# Patient Record
Sex: Male | Born: 1985 | Race: White | Hispanic: No | Marital: Married
Health system: Midwestern US, Academic
[De-identification: ages and names within clinical notes are randomized; demographics above are authoritative.]

---

## 2016-08-24 MED ORDER — TRIAMCINOLONE ACETONIDE 40 MG/ML IJ SUSP
80 mg | Freq: Once | EPIDURAL | 0 refills | Status: CP
Start: 2016-08-24 — End: ?

## 2016-08-24 MED ORDER — IOPAMIDOL 41 % IT SOLN
2.5 mL | Freq: Once | EPIDURAL | 0 refills | Status: CP
Start: 2016-08-24 — End: ?

## 2016-08-24 MED ORDER — BUPIVACAINE (PF) 0.25 % (2.5 MG/ML) IJ SOLN
4 mL | Freq: Once | INTRAMUSCULAR | 0 refills | Status: CP
Start: 2016-08-24 — End: ?

## 2017-02-05 ENCOUNTER — Encounter: Admit: 2017-02-05 | Discharge: 2017-02-05

## 2017-02-05 DIAGNOSIS — D492 Neoplasm of unspecified behavior of bone, soft tissue, and skin: ICD-10-CM

## 2017-02-05 DIAGNOSIS — R0782 Intercostal pain: ICD-10-CM

## 2017-02-05 DIAGNOSIS — R0781 Pleurodynia: ICD-10-CM

## 2017-02-05 DIAGNOSIS — M549 Dorsalgia, unspecified: Principal | ICD-10-CM

## 2017-02-05 MED ORDER — NORTRIPTYLINE 10 MG PO CAP
10 mg | ORAL_CAPSULE | Freq: Every evening | ORAL | 4 refills | Status: AC
Start: 2017-02-05 — End: 2017-05-09

## 2017-02-05 NOTE — Progress Notes
[facility] CLINIC NOTE  Subjective     SUBJECTIVE: [name] presents in follow-up for ongoing care regarding thoracic pain.  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  Most activities including her his duties as an Technical sales engineer in [employer] exacerbate his pain is pain is improved with rest         Review of Systems   Constitutional: Positive for activity change and unexpected weight change.   HENT: Negative.    Eyes: Negative.    Respiratory: Negative.    Cardiovascular: Negative.    Gastrointestinal: Negative.    Endocrine: Negative.    Genitourinary: Negative.    Musculoskeletal: Negative.    Skin: Negative.    Allergic/Immunologic: Negative.    Neurological: Negative.    Hematological: Negative.    Psychiatric/Behavioral: Negative.    All other systems reviewed and are negative.      Current Outpatient Prescriptions:   ???  acetaminophen (TYLENOL) 500 mg tablet, Take 1-2 Tabs by mouth every 6 hours while awake. Max of 4,000 mg of acetaminophen in 24 hours., Disp: , Rfl: 0  ???  gabapentin (NEURONTIN) 300 mg capsule, Take 1 Cap by mouth three times daily. In addition to 600 mg capsule TID for a total of 900 mg TID., Disp: 270 Cap, Rfl: 3  ???  gabapentin (NEURONTIN) 600 mg tablet, Take 1 Tab by mouth three times daily., Disp: 90 Cap, Rfl: 1  ???  HYDROcodone/acetaminophen (NORCO) 5/325 mg tablet, Take 1 Tab by mouth every 12 hours as needed, Disp: 60 Tab, Rfl: 0  ???  ibuprofen (MOTRIN) 600 mg tablet, Take 1 Tab by mouth every 6 hours. Take with food. Drink at least 64oz of water per day while taking this medication. Take this as scheduled every 6 hours for the next 72 hours then you may decrease it to every 6 hours as needed., Disp: 30 Tab, Rfl: 0  ???  melatonin 3 mg tab, Take 3 mg by mouth at bedtime daily., Disp: , Rfl:   ???  nortriptyline (PAMELOR) 10 mg capsule, Take one capsule by mouth at bedtime daily., Disp: 30 capsule, Rfl: 4  No Known Allergies  Physical Exam  Vitals: 02/05/17 1435   BP: 132/85   Pulse: 82   Weight: 88.5 kg (195 lb)   Height: 180.3 cm (71)     Oswestry Total Score:: 42  Pain Score: Seven  Body mass index is 27.2 kg/m???.  General: Alert and oriented, very pleasant male.   HEENT showed extraocular muscles were intact and no other abnormalities.  Unlabored breathing.  Regular rate and rhythm on CV exam.   5/5 strength in bilateral upper and lower extremities.    Sensation is intact to light touch and equal in the upper and lower extremities.  The right anterior chest wall tenderness to palpation         IMPRESSION:  1. Rib pain on right side    2. Intercostal neuralgia          PLAN: We will schedule a 1 month clinic follow-up and will increase gabapentin to 3600 mg 3 times a day and will trial nortriptyline 10 mg at night

## 2017-02-06 ENCOUNTER — Ambulatory Visit: Admit: 2017-02-05 | Discharge: 2017-02-06

## 2017-02-06 DIAGNOSIS — Z9889 Other specified postprocedural states: ICD-10-CM

## 2017-02-06 DIAGNOSIS — R0781 Pleurodynia: Principal | ICD-10-CM

## 2017-02-06 DIAGNOSIS — G588 Other specified mononeuropathies: ICD-10-CM

## 2017-03-13 ENCOUNTER — Encounter: Admit: 2017-03-13 | Discharge: 2017-03-13

## 2017-05-09 ENCOUNTER — Encounter: Admit: 2017-05-09 | Discharge: 2017-05-09

## 2017-05-09 ENCOUNTER — Ambulatory Visit: Admit: 2017-05-09 | Discharge: 2017-05-10

## 2017-05-09 DIAGNOSIS — D492 Neoplasm of unspecified behavior of bone, soft tissue, and skin: ICD-10-CM

## 2017-05-09 DIAGNOSIS — M549 Dorsalgia, unspecified: Principal | ICD-10-CM

## 2017-05-09 DIAGNOSIS — R0781 Pleurodynia: ICD-10-CM

## 2017-05-09 MED ORDER — NORTRIPTYLINE 25 MG PO CAP
25 mg | ORAL_CAPSULE | Freq: Every evening | ORAL | 2 refills | Status: SS
Start: 2017-05-09 — End: 2018-07-07

## 2017-05-10 DIAGNOSIS — G588 Other specified mononeuropathies: Principal | ICD-10-CM

## 2017-05-10 DIAGNOSIS — R1011 Right upper quadrant pain: ICD-10-CM

## 2017-05-28 ENCOUNTER — Encounter: Admit: 2017-05-28 | Discharge: 2017-05-28

## 2017-05-28 ENCOUNTER — Ambulatory Visit: Admit: 2017-05-28 | Discharge: 2017-05-29

## 2017-05-28 DIAGNOSIS — R0781 Pleurodynia: ICD-10-CM

## 2017-05-28 DIAGNOSIS — D492 Neoplasm of unspecified behavior of bone, soft tissue, and skin: ICD-10-CM

## 2017-05-28 DIAGNOSIS — M546 Pain in thoracic spine: Principal | ICD-10-CM

## 2017-05-28 DIAGNOSIS — M549 Dorsalgia, unspecified: Principal | ICD-10-CM

## 2017-05-28 MED ORDER — IOPAMIDOL 41 % IT SOLN
2.5 mL | Freq: Once | EPIDURAL | 0 refills | Status: CP
Start: 2017-05-28 — End: ?
  Administered 2017-05-28: 16:00:00 2.5 mL via EPIDURAL

## 2017-05-28 MED ORDER — TRIAMCINOLONE ACETONIDE 40 MG/ML IJ SUSP
80 mg | Freq: Once | EPIDURAL | 0 refills | Status: CP
Start: 2017-05-28 — End: ?
  Administered 2017-05-28: 16:00:00 80 mg via EPIDURAL

## 2017-05-29 DIAGNOSIS — M546 Pain in thoracic spine: ICD-10-CM

## 2017-05-29 DIAGNOSIS — G588 Other specified mononeuropathies: Principal | ICD-10-CM

## 2017-06-04 MED ORDER — TRIAMCINOLONE ACETONIDE 40 MG/ML IJ SUSP
40 mg | Freq: Once | INTRAMUSCULAR | 0 refills | Status: CP | PRN
Start: 2017-06-04 — End: ?
  Administered 2017-06-04: 18:00:00 40 mg via INTRAMUSCULAR

## 2017-06-04 MED ORDER — BUPIVACAINE (PF) 0.25 % (2.5 MG/ML) IJ SOLN
3 mL | Freq: Once | INTRAMUSCULAR | 0 refills | Status: CP | PRN
Start: 2017-06-04 — End: ?
  Administered 2017-06-04: 18:00:00 3 mL via INTRAMUSCULAR

## 2017-06-28 ENCOUNTER — Encounter: Admit: 2017-06-28 | Discharge: 2017-06-28

## 2017-06-28 DIAGNOSIS — R0781 Pleurodynia: ICD-10-CM

## 2017-06-28 DIAGNOSIS — M549 Dorsalgia, unspecified: Principal | ICD-10-CM

## 2017-06-28 DIAGNOSIS — D492 Neoplasm of unspecified behavior of bone, soft tissue, and skin: ICD-10-CM

## 2017-07-13 ENCOUNTER — Ambulatory Visit: Admit: 2017-07-13 | Discharge: 2017-07-13

## 2017-07-13 ENCOUNTER — Encounter: Admit: 2017-07-13 | Discharge: 2017-07-13

## 2017-07-13 DIAGNOSIS — Z9889 Other specified postprocedural states: ICD-10-CM

## 2017-07-13 DIAGNOSIS — K219 Gastro-esophageal reflux disease without esophagitis: ICD-10-CM

## 2017-07-13 DIAGNOSIS — D492 Neoplasm of unspecified behavior of bone, soft tissue, and skin: ICD-10-CM

## 2017-07-13 DIAGNOSIS — D3610 Benign neoplasm of peripheral nerves and autonomic nervous system, unspecified: ICD-10-CM

## 2017-07-13 DIAGNOSIS — R1011 Right upper quadrant pain: ICD-10-CM

## 2017-07-13 DIAGNOSIS — R079 Chest pain, unspecified: ICD-10-CM

## 2017-07-13 DIAGNOSIS — G8918 Other acute postprocedural pain: Principal | ICD-10-CM

## 2017-07-13 DIAGNOSIS — R1013 Epigastric pain: ICD-10-CM

## 2017-07-13 DIAGNOSIS — R0781 Pleurodynia: ICD-10-CM

## 2017-07-13 DIAGNOSIS — M549 Dorsalgia, unspecified: Principal | ICD-10-CM

## 2017-07-30 ENCOUNTER — Encounter: Admit: 2017-07-30 | Discharge: 2017-07-30

## 2017-08-01 ENCOUNTER — Encounter: Admit: 2017-08-01 | Discharge: 2017-08-01

## 2017-08-01 DIAGNOSIS — K210 Gastro-esophageal reflux disease with esophagitis: ICD-10-CM

## 2017-08-01 DIAGNOSIS — R1011 Right upper quadrant pain: Principal | ICD-10-CM

## 2017-08-01 DIAGNOSIS — R1013 Epigastric pain: ICD-10-CM

## 2017-08-11 DIAGNOSIS — K219 Gastro-esophageal reflux disease without esophagitis: Secondary | ICD-10-CM

## 2017-08-11 DIAGNOSIS — R1013 Epigastric pain: Secondary | ICD-10-CM

## 2017-08-16 ENCOUNTER — Ambulatory Visit: Admit: 2017-08-16 | Discharge: 2017-08-16

## 2017-08-16 ENCOUNTER — Encounter: Admit: 2017-08-16 | Discharge: 2017-08-16

## 2017-08-16 DIAGNOSIS — R079 Chest pain, unspecified: ICD-10-CM

## 2017-08-16 DIAGNOSIS — K760 Fatty (change of) liver, not elsewhere classified: Principal | ICD-10-CM

## 2017-08-16 DIAGNOSIS — R1011 Right upper quadrant pain: Principal | ICD-10-CM

## 2017-08-17 ENCOUNTER — Encounter: Admit: 2017-08-17 | Discharge: 2017-08-17

## 2017-08-24 ENCOUNTER — Encounter: Admit: 2017-08-24 | Discharge: 2017-08-24

## 2017-08-28 ENCOUNTER — Encounter: Admit: 2017-08-28 | Discharge: 2017-08-28

## 2017-08-30 ENCOUNTER — Encounter: Admit: 2017-08-30 | Discharge: 2017-08-30

## 2017-09-12 ENCOUNTER — Encounter: Admit: 2017-09-12 | Discharge: 2017-09-13

## 2017-09-12 ENCOUNTER — Encounter: Admit: 2017-09-12 | Discharge: 2017-09-12

## 2017-09-12 ENCOUNTER — Ambulatory Visit: Admit: 2017-09-12 | Discharge: 2017-09-12

## 2017-09-12 DIAGNOSIS — R1011 Right upper quadrant pain: Principal | ICD-10-CM

## 2017-09-12 DIAGNOSIS — K219 Gastro-esophageal reflux disease without esophagitis: Secondary | ICD-10-CM

## 2017-09-12 DIAGNOSIS — R1013 Epigastric pain: ICD-10-CM

## 2017-09-12 DIAGNOSIS — M549 Dorsalgia, unspecified: Principal | ICD-10-CM

## 2017-09-12 DIAGNOSIS — I10 Essential (primary) hypertension: ICD-10-CM

## 2017-09-12 DIAGNOSIS — R0781 Pleurodynia: ICD-10-CM

## 2017-09-12 DIAGNOSIS — D492 Neoplasm of unspecified behavior of bone, soft tissue, and skin: ICD-10-CM

## 2017-09-12 MED ORDER — LIDOCAINE (PF) 10 MG/ML (1 %) IJ SOLN
.1-2 mL | INTRAMUSCULAR | 0 refills | Status: AC | PRN
Start: 2017-09-12 — End: 2019-06-10

## 2017-09-12 MED ORDER — PROPOFOL 10 MG/ML IV EMUL 20 ML (INFUSION)(AM)(OR)
INTRAVENOUS | 0 refills | Status: DC
Start: 2017-09-12 — End: 2017-09-12

## 2017-09-12 MED ORDER — PROPOFOL INJ 10 MG/ML IV VIAL
0 refills | Status: DC
Start: 2017-09-12 — End: 2017-09-12

## 2017-09-12 MED ORDER — LIDOCAINE (PF) 200 MG/10 ML (2 %) IJ SYRG
0 refills | Status: DC
Start: 2017-09-12 — End: 2017-09-12

## 2017-09-12 MED ORDER — LACTATED RINGERS IV SOLP
1000 mL | INTRAVENOUS | 0 refills | Status: AC
Start: 2017-09-12 — End: ?

## 2017-09-12 MED ORDER — LACTATED RINGERS IV SOLP
INTRAVENOUS | 0 refills | Status: AC
Start: 2017-09-12 — End: ?

## 2017-09-13 ENCOUNTER — Encounter: Admit: 2017-09-13 | Discharge: 2017-09-13

## 2017-09-13 DIAGNOSIS — M549 Dorsalgia, unspecified: Principal | ICD-10-CM

## 2017-09-13 DIAGNOSIS — D492 Neoplasm of unspecified behavior of bone, soft tissue, and skin: ICD-10-CM

## 2017-09-13 DIAGNOSIS — R0781 Pleurodynia: ICD-10-CM

## 2017-09-13 DIAGNOSIS — I10 Essential (primary) hypertension: ICD-10-CM

## 2017-10-04 ENCOUNTER — Encounter: Admit: 2017-10-04 | Discharge: 2017-10-04

## 2017-11-02 ENCOUNTER — Ambulatory Visit: Admit: 2017-11-02 | Discharge: 2017-11-03

## 2017-11-02 ENCOUNTER — Encounter: Admit: 2017-11-02 | Discharge: 2017-11-02

## 2017-11-02 MED ORDER — GABAPENTIN 600 MG PO TAB
1200 mg | ORAL_TABLET | ORAL | 3 refills | Status: SS
Start: 2017-11-02 — End: 2018-07-07

## 2017-11-02 MED ORDER — HYDROCODONE-ACETAMINOPHEN 5-325 MG PO TAB
1 | ORAL_TABLET | Freq: Two times a day (BID) | ORAL | 0 refills | 30.00000 days | Status: AC | PRN
Start: 2017-11-02 — End: 2019-05-20

## 2017-11-03 DIAGNOSIS — G588 Other specified mononeuropathies: Principal | ICD-10-CM

## 2017-11-03 DIAGNOSIS — R0781 Pleurodynia: ICD-10-CM

## 2017-11-28 ENCOUNTER — Ambulatory Visit: Admit: 2017-11-28 | Discharge: 2017-11-29

## 2017-11-28 ENCOUNTER — Encounter: Admit: 2017-11-28 | Discharge: 2017-11-28

## 2017-11-28 DIAGNOSIS — I10 Essential (primary) hypertension: ICD-10-CM

## 2017-11-28 DIAGNOSIS — R0781 Pleurodynia: ICD-10-CM

## 2017-11-28 DIAGNOSIS — D492 Neoplasm of unspecified behavior of bone, soft tissue, and skin: ICD-10-CM

## 2017-11-28 DIAGNOSIS — M549 Dorsalgia, unspecified: Principal | ICD-10-CM

## 2017-11-28 MED ORDER — TRIAMCINOLONE ACETONIDE 40 MG/ML IJ SUSP
40 mg | Freq: Once | EPIDURAL | 0 refills | Status: CP
Start: 2017-11-28 — End: ?
  Administered 2017-11-28: 18:00:00 40 mg via EPIDURAL

## 2017-11-28 MED ORDER — IOPAMIDOL 41 % IT SOLN
2.5 mL | Freq: Once | EPIDURAL | 0 refills | Status: CP
Start: 2017-11-28 — End: ?
  Administered 2017-11-28: 18:00:00 2.5 mL via EPIDURAL

## 2017-11-29 ENCOUNTER — Encounter: Admit: 2017-11-29 | Discharge: 2017-11-29

## 2017-11-29 DIAGNOSIS — G588 Other specified mononeuropathies: Principal | ICD-10-CM

## 2017-11-29 DIAGNOSIS — I10 Essential (primary) hypertension: ICD-10-CM

## 2018-04-23 ENCOUNTER — Encounter: Admit: 2018-04-23 | Discharge: 2018-04-23

## 2018-05-17 ENCOUNTER — Encounter: Admit: 2018-05-17 | Discharge: 2018-05-17

## 2018-05-17 DIAGNOSIS — R0781 Pleurodynia: ICD-10-CM

## 2018-05-17 DIAGNOSIS — G588 Other specified mononeuropathies: Principal | ICD-10-CM

## 2018-05-17 NOTE — Telephone Encounter
MA spoke with patient wife. She states that [facility] sent over a new referral for patient to have another nerve block. She called to schedule and was told Dr. Samara Deist would have to put in a new order.  Please place order for nerve block.

## 2018-05-21 ENCOUNTER — Encounter: Admit: 2018-05-21 | Discharge: 2018-05-21

## 2018-05-28 ENCOUNTER — Encounter: Admit: 2018-05-28 | Discharge: 2018-05-28

## 2018-05-28 ENCOUNTER — Ambulatory Visit: Admit: 2018-05-28 | Discharge: 2018-05-29

## 2018-05-28 ENCOUNTER — Ambulatory Visit: Admit: 2018-05-28 | Discharge: 2018-05-28

## 2018-05-28 DIAGNOSIS — R0781 Pleurodynia: ICD-10-CM

## 2018-05-28 DIAGNOSIS — D492 Neoplasm of unspecified behavior of bone, soft tissue, and skin: ICD-10-CM

## 2018-05-28 DIAGNOSIS — M549 Dorsalgia, unspecified: Principal | ICD-10-CM

## 2018-05-28 DIAGNOSIS — I10 Essential (primary) hypertension: ICD-10-CM

## 2018-05-28 DIAGNOSIS — G588 Other specified mononeuropathies: Principal | ICD-10-CM

## 2018-05-28 MED ORDER — LIDOCAINE (PF) 10 MG/ML (1 %) IJ SOLN
2 mL | Freq: Once | INTRAMUSCULAR | 0 refills | Status: CP
Start: 2018-05-28 — End: ?

## 2018-05-28 MED ORDER — TRIAMCINOLONE ACETONIDE 40 MG/ML IJ SUSP
80 mg | Freq: Once | EPIDURAL | 0 refills | Status: CP
Start: 2018-05-28 — End: ?

## 2018-05-28 MED ORDER — IOHEXOL 300 MG IODINE/ML IV SOLN
2 mL | Freq: Once | 0 refills | Status: CP
Start: 2018-05-28 — End: ?

## 2018-05-28 NOTE — Discharge Instructions - Supplementary Instructions
at 20 minute intervals for the first 24 hours. The following day you may alternate ice with heat if you are experiencing muscle tightness, otherwise continue with ice. Ice works best at decreasing pain. Avoid application of direct heat, hot showers or hot tubs today.  4. Avoid strenuous activity today. You many resume your regular activities and exercise tomorrow.  5. Patients with diabetes may see an elevation in blood sugars for 7-10 days after the injection. It is important to pay close attention to you diet, check your blood sugars daily and repost extreme elevations to the physician that treats your diabetes.  6. Patients taking daily blood thinners can resume their regular dose this evening.  7. It is important that you take all medications ordered by your pain physician. Taking medications as ordered is an important part of you pain care plan. If you cannot continue the medication plan, please notify the physician.  Possible side effects to steroids that may occur:  ? Flushing or redness of the face  ? Irritability  ? Fluid retention  ? Change in women's menses  Follow up appointment as needed if in the event you are unable to keep an appointment please notify the scheduler 24 hours in advance at [phone].

## 2018-05-29 ENCOUNTER — Encounter: Admit: 2018-05-29 | Discharge: 2018-05-29

## 2018-05-29 DIAGNOSIS — G588 Other specified mononeuropathies: Principal | ICD-10-CM

## 2018-07-06 ENCOUNTER — Emergency Department: Admit: 2018-07-06 | Discharge: 2018-07-06

## 2018-07-06 ENCOUNTER — Encounter: Admit: 2018-07-06 | Discharge: 2018-07-06

## 2018-07-06 ENCOUNTER — Inpatient Hospital Stay: Admit: 2018-07-06 | Discharge: 2018-07-06

## 2018-07-06 DIAGNOSIS — R0781 Pleurodynia: ICD-10-CM

## 2018-07-06 DIAGNOSIS — R630 Anorexia: ICD-10-CM

## 2018-07-06 DIAGNOSIS — I10 Essential (primary) hypertension: ICD-10-CM

## 2018-07-06 DIAGNOSIS — B002 Herpesviral gingivostomatitis and pharyngotonsillitis: Secondary | ICD-10-CM

## 2018-07-06 DIAGNOSIS — M549 Dorsalgia, unspecified: Principal | ICD-10-CM

## 2018-07-06 DIAGNOSIS — D492 Neoplasm of unspecified behavior of bone, soft tissue, and skin: ICD-10-CM

## 2018-07-06 DIAGNOSIS — Z20828 Contact with and (suspected) exposure to other viral communicable diseases: ICD-10-CM

## 2018-07-06 LAB — RVP VIRAL PANEL PCR

## 2018-07-06 LAB — MONO SPOT: Lab: NEGATIVE

## 2018-07-06 LAB — POC TROPONIN: Lab: 0 ng/mL (ref 0.00–0.05)

## 2018-07-06 LAB — POC SODIUM: Lab: 140 MMOL/L (ref 137–147)

## 2018-07-06 LAB — RAPID STREP A SCREEN

## 2018-07-06 LAB — POC BLOOD GAS VEN
Lab: 22 MMOL/L
Lab: 7.5 % — ABNORMAL HIGH (ref 7.30–7.40)

## 2018-07-06 LAB — FERRITIN: Lab: 80 ng/mL — ABNORMAL LOW (ref 30–300)

## 2018-07-06 LAB — D-DIMER: Lab: 396 ng{FEU}/mL (ref <500)

## 2018-07-06 LAB — CBC AND DIFF: Lab: 6 10*3/uL (ref 4.5–11.0)

## 2018-07-06 LAB — PROTIME INR (PT): Lab: 1.2 M/UL (ref 0.8–1.2)

## 2018-07-06 LAB — POC HEMATOCRIT: Lab: 15 g/dL (ref 13.5–16.5)

## 2018-07-06 LAB — POC IONIZED CALCIUM: Lab: 1.1 MMOL/L (ref 1.0–1.3)

## 2018-07-06 LAB — POC LACTATE: Lab: 2.7 MMOL/L — ABNORMAL HIGH (ref 0.5–2.0)

## 2018-07-06 LAB — C REACTIVE PROTEIN (CRP): Lab: 3.3 mg/dL — ABNORMAL HIGH (ref <1.0)

## 2018-07-06 MED ORDER — ACETAMINOPHEN 325 MG PO TAB
650 mg | Freq: Once | ORAL | 0 refills | Status: CP
Start: 2018-07-06 — End: ?
  Administered 2018-07-06: 22:00:00 650 mg via ORAL

## 2018-07-06 MED ORDER — GABAPENTIN 300 MG PO CAP
900 mg | Freq: Three times a day (TID) | ORAL | 0 refills | Status: DC
Start: 2018-07-06 — End: 2018-07-12
  Administered 2018-07-07 – 2018-07-12 (×19): 900 mg via ORAL

## 2018-07-06 MED ORDER — POTASSIUM CHLORIDE IN WATER 10 MEQ/50 ML IV PGBK
10 meq | INTRAVENOUS | 0 refills | Status: CP
Start: 2018-07-06 — End: ?
  Administered 2018-07-06 – 2018-07-07 (×4): 10 meq via INTRAVENOUS

## 2018-07-06 MED ORDER — HYDROCODONE-ACETAMINOPHEN 5-325 MG PO TAB
1 | Freq: Two times a day (BID) | ORAL | 0 refills | Status: DC | PRN
Start: 2018-07-06 — End: 2018-07-12
  Administered 2018-07-06 – 2018-07-09 (×5): 1 via ORAL

## 2018-07-06 MED ORDER — ACETAMINOPHEN 325 MG PO TAB
650 mg | Freq: Once | ORAL | 0 refills | Status: CP
Start: 2018-07-06 — End: ?
  Administered 2018-07-07: 03:00:00 650 mg via ORAL

## 2018-07-06 MED ORDER — ENOXAPARIN 40 MG/0.4 ML SC SYRG
40 mg | Freq: Every day | SUBCUTANEOUS | 0 refills | Status: DC
Start: 2018-07-06 — End: 2018-07-12
  Administered 2018-07-07 – 2018-07-10 (×4): 40 mg via SUBCUTANEOUS

## 2018-07-06 MED ORDER — LACTATED RINGERS IV SOLP
1000 mL | Freq: Once | INTRAVENOUS | 0 refills | Status: CP
Start: 2018-07-06 — End: ?
  Administered 2018-07-06: 22:00:00 1000 mL via INTRAVENOUS

## 2018-07-06 MED ORDER — BENZONATATE 100 MG PO CAP
100 mg | Freq: Once | ORAL | 0 refills | Status: CP | PRN
Start: 2018-07-06 — End: ?
  Administered 2018-07-06: 22:00:00 100 mg via ORAL

## 2018-07-06 MED ADMIN — SODIUM CHLORIDE 0.9 % IV SOLP [27838]: 1000 mL | INTRAVENOUS | @ 23:00:00 | Stop: 2018-07-06 | NDC 00338004904

## 2018-07-06 NOTE — ED Notes
Pt is a 33 y.o. male who presents to the ED for a CC of SOB. Pt has a past medical history of Back pain, Hypertension, Rib pain, and Thoracic spine tumor.     Pt reports shortness of breath, fever, and cough that started two days ago. Pt states that he went to [facility] in [place] last evening and was tested for COVID 19 and sent home. Pt presents to [place] for worsening SOB and pt hard to speak in full sentences. Pt reports pain in ribs and chest wall during inspiration or coughing. Afebrile on assessment but reports a fever at 0700 at 100.5 and took tylenol then.   Pt AA&Ox4;  Call light within reach, bed in lowest and locked position; oriented to surroundings  Await MD eval.     All belongings gathered and placed in belonging bag with patient labels at bedside.  The bag(s) contain(s) the following:  Clothing: shirt; jeans; socks  Shoes: shoes  Jewelry: ring  Identification/Driver's License: Yes  Cash: 0  Credit Cards: 0  Electronics: cell phone

## 2018-07-07 ENCOUNTER — Inpatient Hospital Stay: Admit: 2018-07-07 | Discharge: 2018-07-07

## 2018-07-07 LAB — FREE T4-FREE THYROXINE: Lab: 0.8 ng/dL (ref 0.6–1.6)

## 2018-07-07 LAB — MAGNESIUM: Lab: 1.7 mg/dL (ref 1.6–2.6)

## 2018-07-07 LAB — TSH WITH FREE T4 REFLEX: Lab: 0.1 uU/mL — ABNORMAL LOW (ref 0.35–5.00)

## 2018-07-07 LAB — PHOSPHORUS: Lab: 3.3 mg/dL (ref 2.0–4.5)

## 2018-07-07 MED ORDER — DIPHENHYDRAMINE(#)/LIDOCAINE/ANTACID 1:1:1 PO SUSP
30 mL | Freq: Three times a day (TID) | ORAL | 0 refills | Status: DC | PRN
Start: 2018-07-07 — End: 2018-07-12
  Administered 2018-07-08: 02:00:00 30 mL via ORAL

## 2018-07-07 MED ORDER — LACTATED RINGERS IV SOLP
INTRAVENOUS | 0 refills | Status: DC
Start: 2018-07-07 — End: 2018-07-12
  Administered 2018-07-07 – 2018-07-12 (×12): 1000.000 mL via INTRAVENOUS

## 2018-07-07 MED ORDER — ACETAMINOPHEN 500 MG PO TAB
500 mg | ORAL | 0 refills | Status: DC | PRN
Start: 2018-07-07 — End: 2018-07-12
  Administered 2018-07-07 – 2018-07-10 (×7): 500 mg via ORAL

## 2018-07-07 MED ORDER — DOXYCYCLINE HYCLATE 100 MG PO TAB
100 mg | Freq: Two times a day (BID) | ORAL | 0 refills | Status: DC
Start: 2018-07-07 — End: 2018-07-08
  Administered 2018-07-08 (×2): 100 mg via ORAL

## 2018-07-07 MED ORDER — ONDANSETRON HCL (PF) 4 MG/2 ML IJ SOLN
4 mg | INTRAVENOUS | 0 refills | Status: DC | PRN
Start: 2018-07-07 — End: 2018-07-12
  Administered 2018-07-08 – 2018-07-09 (×2): 4 mg via INTRAVENOUS

## 2018-07-07 MED ORDER — CEFTRIAXONE INJ 1GM IVP
1 g | INTRAVENOUS | 0 refills | Status: DC
Start: 2018-07-07 — End: 2018-07-08
  Administered 2018-07-07 – 2018-07-08 (×2): 1 g via INTRAVENOUS

## 2018-07-07 NOTE — Progress Notes
Pt notified RN that he had vomited up blood tinged liquid. Pt stated he was on the phone with family and was coughing and threw up on the floor. MD notified via text page (Tracking [phone]). Pt not feeling nauseous at this time. O2 100% on RA. RN will continue to monitor.

## 2018-07-07 NOTE — Progress Notes
1600 RN to patient's room due to patients HR sustaining in the 150's-160's. Patient was throwing up in the toilet and stated I fell so terrible. Patient was dizzy and lightheaded upon standing and walking back to bed. MD paged.

## 2018-07-08 ENCOUNTER — Encounter: Admit: 2018-07-08 | Discharge: 2018-07-08

## 2018-07-08 ENCOUNTER — Inpatient Hospital Stay: Admit: 2018-07-08 | Discharge: 2018-07-08

## 2018-07-08 DIAGNOSIS — I10 Essential (primary) hypertension: ICD-10-CM

## 2018-07-08 DIAGNOSIS — M549 Dorsalgia, unspecified: Principal | ICD-10-CM

## 2018-07-08 DIAGNOSIS — R0781 Pleurodynia: ICD-10-CM

## 2018-07-08 DIAGNOSIS — R0602 Shortness of breath: ICD-10-CM

## 2018-07-08 DIAGNOSIS — D492 Neoplasm of unspecified behavior of bone, soft tissue, and skin: ICD-10-CM

## 2018-07-08 LAB — CULTURE-STREP SCREEN

## 2018-07-08 LAB — TROPONIN-I: Lab: 0 ng/mL — ABNORMAL LOW (ref >60)

## 2018-07-08 MED ORDER — AMOXICILLIN-POT CLAVULANATE 875-125 MG PO TAB
875 mg | Freq: Two times a day (BID) | ORAL | 0 refills | Status: DC
Start: 2018-07-08 — End: 2018-07-12
  Administered 2018-07-08 – 2018-07-12 (×8): 875 mg via ORAL

## 2018-07-08 MED ORDER — ACETAMINOPHEN/LIDOCAINE/ANTACID DS(#) 1:1:3  PO SUSP
30 mL | Freq: Once | ORAL | 0 refills | Status: CP
Start: 2018-07-08 — End: ?
  Administered 2018-07-08: 11:00:00 30 mL via ORAL

## 2018-07-08 MED ORDER — TRAMADOL 50 MG PO TAB
50 mg | Freq: Once | ORAL | 0 refills | Status: CP
Start: 2018-07-08 — End: ?
  Administered 2018-07-09: 05:00:00 50 mg via ORAL

## 2018-07-08 MED ORDER — LIDOCAINE 5 % TP PTMD
1 | Freq: Every day | TOPICAL | 0 refills | Status: DC
Start: 2018-07-08 — End: 2018-07-12

## 2018-07-08 MED ORDER — PATCH DOCUMENTATION - LIDOCAINE 5%: TRANSDERMAL | 0 refills | Status: DC

## 2018-07-08 MED ORDER — VALACYCLOVIR 500 MG PO TAB
1000 mg | Freq: Two times a day (BID) | ORAL | 0 refills | Status: DC
Start: 2018-07-08 — End: 2018-07-12
  Administered 2018-07-09 – 2018-07-12 (×8): 1000 mg via ORAL

## 2018-07-08 NOTE — Consults
He noticed a sore on lip later (not sure day). He had temp of 102 then 103 the following 2 days with worsening mouth pain. He then noticed a new pain R lateral chest - same area but different in quality to his nml CP.  He had some sob In that it was harder to take a deep breath. He had no sinus pain, drainage but + HA and some earache. He had occ mild cough the day he went to [place] and had covid testing (now back and negative). He was told he Might have pna on xray but sent home w/o antibiotic and no other testing per pt. 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 He denies any known sick contacts at home - daughter w flu a but that was a couple mo ago. He had no known positive COVID-19 contacts. While waiting for test result his cp worse, had some cough and was on phone w his mother who encouraged him to come to [place].  In ED afebrile, RR 26, o2 sat 96^, BP nml, HR 97.  Lungs: clear Trop 0, wbc 6, crp 332, Ferr 80, lactate 2.7, rapid strep neg, ddimer 396, alc 800. COVID from outside neg. Internal COVID negative. RVP neg. Mono neg. CXR min bibasilar atelectasis vs opacities? Improved on 4/13 film.  He had fever to 39 on 4/12. He has been on RA.    He does not feel any better today (notably worsening gingival pain). Had noticed some bleeding w 1 episode emesis he had (streaks), no abd pain, diarrhea. No neck pain but has some pain w neck flexion due to LN on R side of neck. Had an unprovoked bruise R arm a few wks ago. No other easy bruising /bleeding.No weight loss.      Antimicrobial Start date End date   Rocephin/doxy 4/12 4/13                                 Estimated Creatinine Clearance: 129.1 mL/min (based on SCr of 1.03 mg/dL).    Past Medical History     Medical History:   Diagnosis Date   ??? Back pain    ??? Hypertension    ??? Rib pain    ??? Thoracic spine tumor Past Surgical History     Surgical History:   Procedure Laterality Date   ??? SINUS SURGERY  1995   ??? BUNIONECTOMY  2013   ??? APPENDECTOMY  03/26/12   ??? LASIK  05/13/15   ??? RIGHT VIDEO ASSISTED THORACOSCOPY, RIGHT T8-T9 TUMOR EXCISION Right 08/17/2015    Performed by Bryson Dames, MD at [facility]   ??? ESOPHAGOGASTRODUODENOSCOPY WITH SPECIMEN COLLECTION BY BRUSHING/ WASHING N/A 09/12/2017    Performed by Eliott Nine, MD at [facility] OR   ??? TONSIL AND ADENOIDECTOMY     ??? WISDOM TEETH EXTRACTION         Social History   Marital status/area of residence: married, lives w/wife and 2 kids  Job/occupation: Hotel manager recruit, active duty prior [place]  Sexual history:wife only  Travel history: see above, not recently  Recent ill contacts: none known  TB exposure: no, prior neg testing  Drugs of abuse: no  Social History     Tobacco Use   ??? Smoking status:  Never Smoker   ??? Smokeless tobacco: Current User     Types: Chew   ??? Tobacco comment: 2 pouches/week   Substance Use Topics   ??? Alcohol use: Yes     Alcohol/week: 12.0 standard drinks     Types: 12 Cans of beer per week       Family History     Family History   Problem Relation Age of Onset   ??? Diabetes Mother    ??? COPD Father    ??? Cancer Paternal Grandmother    ??? Cancer Paternal Grandfather        Allergies     Allergies   Allergen Reactions   ??? Oxycodone ANXIETY       Review of Systems   A comprehensive 14-point review of systems was negative with exception of:  Gingival pain  Blood in emesis, nausea, dec appetite  Lip sore  Ln in neck  R chest wall pain  Fever  RUQ pain    Medications   Scheduled Meds:cefTRIAXone (ROCEPHIN) IVP 1 g, 1 g, Intravenous, Q24H*  doxycycline (VIBRAMYCIN) tablet 100 mg, 100 mg, Oral, BID  enoxaparin (LOVENOX) syringe 40 mg, 40 mg, Subcutaneous, QDAY(21)  gabapentin (NEURONTIN) capsule 900 mg, 900 mg, Oral, TID  lidocaine (LIDODERM) 5 % topical patch 1 patch, 1 patch, Topical, QDAY    And

## 2018-07-08 NOTE — Progress Notes
07/07/18 2047   Vitals   Temp (!) 39 ???C (102.2 ???F)   Temperature Source Oral     Med Private B text paged about pt's unresolved fever. Tylenol not available until 2220. RN administered PRN Norco at this time. Will recheck in 1 hour.

## 2018-07-08 NOTE — Care Plan
Pt's COVID testing is negative.  Thus, pt is being moved from a COVID med team and unit to the following new med team:   New bed: [facility] 801  New team: med E, Dr Lucia Estelle attending, pager [phone]     Change of service order placed.

## 2018-07-08 NOTE — Case Management (ED)
Does residence have entry and/or side stairs?: No  Assistance needed prior to admit or anticipated on discharge: No  Who provides assistance or could if needed?: Pts wife   Are they in good health?: Unknown  Can support system provide 24/7 care if needed?: Maybe  ? Level of Function   Prior level of function: Independent  ? Cognitive Abilities   Cognitive Abilities: Alert and Oriented, Participates in decision making, Recognizes impact of health condition on lifestyle, Engages in problem solving and planning, Understands nature of health condition    Financial Resources  ? Coverage  Primary Insurance: Commercial insurance(Tricare)  Additional Coverage: RX    ? Source of Income   Source Of Income: Employed  ? Financial Assistance Needed?  N/A    Psychosocial Needs  ? Mental Health  Mental Health History: No  ? Substance Use History  Substance Use History Screen: Yes  Comment: Chew 1 can/week  ? Other  N/A    Current/Previous Services  ? PCP  Casimiro Needle, [phone], [phone]  ? Pharmacy    [facility]  [address]  [place] [address]  Phone: [phone] Fax: [phone]    [facility] [address] - [place] - [address]  [address]  [place] [address]  Phone: [phone] Fax: [phone]    [facility]  [address]  [place] [address]  Phone: [phone] Fax: [phone]    ? Durable Medical Equipment   Durable Medical Equipment at home: None  ? Home Health  Receiving home health: No  ? Hemodialysis or Peritoneal Dialysis  Undergoing hemodialysis or peritoneal dialysis: No  ? Tube/Enteral Feeds  Receive tube/enteral feeds: No  ? Infusion  Receive infusions: No  ? Private Duty  Private duty help used: No  ? Home and Community Based Services  Home and community based services: No  ? [employer]: N/A  ? Hospice  Hospice: No  ? Outpatient Therapy  PT: No  OT: No  SLP: No  ? Skilled Nursing Facility/Nursing Home  SNF: No

## 2018-07-08 NOTE — Progress Notes
Pt c/o 7/10 abd pain; states that norco and tylenol don't really work for him but agreeable to attempting both together.    Pt also c/o pain when biting and swallowing. Refuses to try magic mouthwash; pt states that he feels that it caused redness to spread in upper cleft when last attempted. Gingiva edematous and red. No bleeding noted at this time.     A&Ox4, VSS, RA, moves all extremities well.    Will continue to monitor.

## 2018-07-08 NOTE — Progress Notes
Patient arrived to room [phone] via wheelchair accompanied by transport. Patient transferred to the bed with assistance. Bedside safety checks completed. Initial patient assessment completed. Refer to flowsheet for details.    Admission skin assessment completed with: Maddie E., RN    Pressure injury present on arrival?: No      See Doc Flowsheet for additional wound details.     INTERVENTIONS: N/A

## 2018-07-08 NOTE — Progress Notes
-   Monitor blood pressure, currently stable  ???  FEN: IVF, monitor lytes, Regular diet  Ppx: Lovenox  FULL CODE  Dispo: continue inpatient  ID consulted  ________________________________________________________________________    Subjective  Franklin Graves is a 33 y.o. male.  Patient reports feeling tired, cough, sore throat, dyspnea, loss of sense of smell/taste, gum pain  Tenderness in Right lower chest/RUQ    Medications  Scheduled Meds:cefTRIAXone (ROCEPHIN) IVP 1 g, 1 g, Intravenous, Q24H*  doxycycline (VIBRAMYCIN) tablet 100 mg, 100 mg, Oral, BID  enoxaparin (LOVENOX) syringe 40 mg, 40 mg, Subcutaneous, QDAY(21)  gabapentin (NEURONTIN) capsule 900 mg, 900 mg, Oral, TID  lidocaine (LIDODERM) 5 % topical patch 1 patch, 1 patch, Topical, QDAY    And  Verification of Patch Placement and Integrity - Lidocaine 5%, , Transdermal, BID    Continuous Infusions:  ??? lactated ringers infusion 100 mL/hr at 07/08/18 0409     PRN and Respiratory Meds:acetaminophen Q6H PRN, diphenhydrAMINE/lidocaine/antacid (#) TID PRN, HYDROcodone/acetaminophen Q12H PRN, ondansetron (ZOFRAN) IV Q6H PRN        Objective:                          Vital Signs: Last Filed                 Vital Signs: 24 Hour Range   BP: 130/70 (04/13 0900)  Temp: 37.6 ???C (99.7 ???F) (04/13 0900)  Pulse: 105 (04/13 0900)  Respirations: 22 PER MINUTE (04/13 0546)  SpO2: 95 % (04/13 0900) BP: (125-146)/(70-91)   Temp:  [37.1 ???C (98.8 ???F)-39 ???C (102.2 ???F)]   Pulse:  [84-110]   Respirations:  [20 PER MINUTE-24 PER MINUTE]   SpO2:  [93 %-97 %]    Intensity Pain Scale (Self Report): 7 (07/08/18 4540) Vitals:    07/06/18 1419   Weight: 90.3 kg (199 lb)       Intake/Output Summary:  (Last 24 hours)    Intake/Output Summary (Last 24 hours) at 07/08/2018 1014  Last data filed at 07/08/2018 0726  Gross per 24 hour   Intake 1905 ml   Output 800 ml   Net 1105 ml           Physical Exam  General:  Alert, awake, appears to be in discomfort

## 2018-07-08 NOTE — Progress Notes
0535: Pt vomiting in bathroom. RN and PCA assisted patient back to bed. Dr. Cherly Beach to place orders for GI cocktail.    1610: VS taken at this time. O2 93% on RA, HR 109, Temp 99.16F, RR 22. With RN at bedside, pt develops sharp 6/10 left-sided chest pain. Dr. Cherly Beach notified via [place]. RN awaiting orders.     0550: Pt stating this pain has now moved to right side and is affecting his arm. Dr. Cherly Beach notified and orders placed for EKG, CXR, troponin. GI cocktail administered at this time. RN will continue to monitor.

## 2018-07-09 DIAGNOSIS — R0602 Shortness of breath: Secondary | ICD-10-CM

## 2018-07-09 LAB — CMV AB IGM: Lab: NEGATIVE

## 2018-07-09 LAB — HIV 1& 2 AG-AB SCRN W REFLEX HIV 1 PCR QUANT

## 2018-07-09 LAB — EPSTEIN BARR  PANEL(EBV)
Lab: NEGATIVE
Lab: NEGATIVE
Lab: POSITIVE
Lab: POSITIVE

## 2018-07-09 LAB — PERIPHERAL SMEAR

## 2018-07-09 LAB — C REACTIVE PROTEIN (CRP): Lab: 8.8 mg/dL — ABNORMAL HIGH (ref <1.0)

## 2018-07-09 LAB — SYPHILIS AB SCREEN: Lab: NEGATIVE MMOL/L (ref 21–28)

## 2018-07-09 LAB — D-DIMER: Lab: 497 ng{FEU}/mL (ref <500)

## 2018-07-09 LAB — LIPASE: Lab: 17 U/L (ref 11–82)

## 2018-07-09 LAB — FERRITIN: Lab: 179 ng/mL (ref 30–300)

## 2018-07-09 LAB — LDH-LACTATE DEHYDROGENASE: Lab: 130 U/L (ref 100–210)

## 2018-07-09 MED ORDER — SODIUM CHLORIDE 0.9 % IJ SOLN
50 mL | Freq: Once | INTRAVENOUS | 0 refills | Status: CP
Start: 2018-07-09 — End: ?
  Administered 2018-07-09: 07:00:00 50 mL via INTRAVENOUS

## 2018-07-09 MED ORDER — DIPHENHYDRAMINE HCL 25 MG PO CAP
25 mg | Freq: Once | ORAL | 0 refills | Status: CP
Start: 2018-07-09 — End: ?
  Administered 2018-07-10: 05:00:00 25 mg via ORAL

## 2018-07-09 MED ORDER — MAGNESIUM SULFATE IN D5W 1 GRAM/100 ML IV PGBK
1 g | Freq: Once | INTRAVENOUS | 0 refills | Status: CP
Start: 2018-07-09 — End: ?
  Administered 2018-07-10: 05:00:00 1 g via INTRAVENOUS

## 2018-07-09 MED ORDER — PROCHLORPERAZINE EDISYLATE 5 MG/ML IJ SOLN
10 mg | Freq: Once | INTRAVENOUS | 0 refills | Status: CP
Start: 2018-07-09 — End: ?
  Administered 2018-07-10: 05:00:00 10 mg via INTRAVENOUS

## 2018-07-09 MED ORDER — IOHEXOL 350 MG IODINE/ML IV SOLN
65 mL | Freq: Once | INTRAVENOUS | 0 refills | Status: CP
Start: 2018-07-09 — End: ?
  Administered 2018-07-09: 07:00:00 65 mL via INTRAVENOUS

## 2018-07-09 NOTE — Progress Notes
MPE notified of patient's worsening headache, and painful, swollen jaw. Orders received, will continue to monitor the patients.

## 2018-07-09 NOTE — Progress Notes
Has new aching across jaw, LN in neck still sore  No neck pain, cough sporadic/dry  CP on R side especially w pressure; not sob  No n/v  No BM for now several days  Gums not improved     Antimicrobial Start date End date   Rocephin/doxy 4/12 4/13   augmentin 4/13    valtrex 4/13                        Estimated Creatinine Clearance: 114 mL/min (based on SCr of 1.16 mg/dL).    Medications   Scheduled Meds:amoxicillin-potassium clavulanate (AUGMENTIN) tablet 875 mg, 875 mg, Oral, BID w/meals  enoxaparin (LOVENOX) syringe 40 mg, 40 mg, Subcutaneous, QDAY(21)  gabapentin (NEURONTIN) capsule 900 mg, 900 mg, Oral, TID  lidocaine (LIDODERM) 5 % topical patch 1 patch, 1 patch, Topical, QDAY    And  Verification of Patch Placement and Integrity - Lidocaine 5%, , Transdermal, BID  valACYclovir (VALTREX) tablet 1,000 mg, 1,000 mg, Oral, BID    Continuous Infusions:  ??? lactated ringers infusion 100 mL/hr at 07/09/18 1036     PRN and Respiratory Meds:acetaminophen Q6H PRN, diphenhydrAMINE/lidocaine/antacid (#) TID PRN, HYDROcodone/acetaminophen Q12H PRN, ondansetron (ZOFRAN) IV Q6H PRN      Physical Examination                          Vital Signs: Last                  Vital Signs: 24 Hour Range   BP: 127/92 (04/14 1126)  Temp: 36.4 ???C (97.6 ???F) (04/14 1126)  Pulse: 90 (04/14 1126)  Respirations: 16 PER MINUTE (04/14 1126)  SpO2: 99 % (04/14 1126)  Height: 180.3 cm (70.98) (04/13 1604) BP: (120-155)/(75-94)   Temp:  [36.4 ???C (97.6 ???F)-38.1 ???C (100.5 ???F)]   Pulse:  [88-103]   Respirations:  [16 PER MINUTE-18 PER MINUTE]   SpO2:  [96 %-99 %]      General appearance: alert, oriented, NAD  HENT: mucus membranes moist, fever blister tip of tongue is unchanged, 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

## 2018-07-10 ENCOUNTER — Encounter: Admit: 2018-07-10 | Discharge: 2018-07-10

## 2018-07-10 LAB — CBC AND DIFF: Lab: 4.9 10*3/uL — ABNORMAL LOW (ref 4.5–11.0)

## 2018-07-10 MED ORDER — MAGNESIUM SULFATE IN D5W 1 GRAM/100 ML IV PGBK
1 g | Freq: Once | INTRAVENOUS | 0 refills | Status: CP
Start: 2018-07-10 — End: ?
  Administered 2018-07-10: 22:00:00 1 g via INTRAVENOUS

## 2018-07-10 MED ORDER — METOCLOPRAMIDE HCL 5 MG/ML IJ SOLN
10 mg | Freq: Once | INTRAVENOUS | 0 refills | Status: CP
Start: 2018-07-10 — End: ?
  Administered 2018-07-10: 22:00:00 10 mg via INTRAVENOUS

## 2018-07-10 MED ORDER — NORTRIPTYLINE 25 MG PO CAP
25 mg | Freq: Every evening | ORAL | 0 refills | Status: DC
Start: 2018-07-10 — End: 2018-07-12
  Administered 2018-07-11 – 2018-07-12 (×2): 25 mg via ORAL

## 2018-07-10 MED ORDER — KETOROLAC 30 MG/ML (1 ML) IJ SOLN
30 mg | Freq: Once | INTRAVENOUS | 0 refills | Status: CP
Start: 2018-07-10 — End: ?
  Administered 2018-07-10: 22:00:00 30 mg via INTRAVENOUS

## 2018-07-10 NOTE — Progress Notes
Abdomen: soft, tender in RUQ, less so RLQ , non-distended, normoactive bowel sounds  Ext:  No LE edema  Skin: no rash  Lymph: +cervical (R>L), no axillary adenopathy    Lines: PIV    Lab Review   Hematology  Recent Labs     07/08/18  0410 07/09/18  0450 07/10/18  0412   WBC 6.2 5.7 4.9   HGB 15.4 15.0 14.9   HCT 45.1 44.5 43.4   PLTCT 199 190 203     Chemistry  Recent Labs     07/08/18  0410 07/09/18  0450 07/10/18  0412   NA 138 139 139   K 3.6 4.8 4.3   CL 104 103 104   CO2 24 29 28    BUN 8 7 8    CR 1.03 1.16 0.99   GFR >60 >60 >60   GLU 116* 103* 99   CA 8.7 8.9 8.9   ALBUMIN 3.8 3.6 3.5   ALKPHOS 48 44 43   AST 15 16 16    ALT 15 15 16    TOTBILI 0.4 0.5 0.4       Microbiology, Radiology and other Diagnostics Review   Microbiology data reviewed.    Pertinent radiology images viewed.         Sadie Haber, DO   Pager [phone]  Infectious Diseases [facility]

## 2018-07-10 NOTE — Progress Notes
Pt c/o 10/10 headache. Also c/o rib cage and jaw pain.     PRN Tylenol administered.     Pt states Tylenol did not help. Pt also c/o dizziness laying in bed and some visual impairment with his HA stating it looks like the room is a carousel. Pt states his visual impairment with the HA is not new as this started a couple days ago.     A&Ox4, VSS, RA.     MPE notified of unrseolving HA. Dr. Simeon Craft to place orders for HA cocktail.     Fall bundle implemented d/t dizziness and visual impairments. Pt verbalized understanding. Will continue to monitor.

## 2018-07-10 NOTE — Progress Notes
- Upper and lower gingival swelling and redness noted, no drainage appreciated  - Patient does report using chew tobacco chronically. No noted medications that would cause gingival hyperplasia. Cell counts WNL/Lymphopenia.  - Panorex: No untreated dental caries or periapical lucency to suggest apical root abscess  -CT of the maxillofacial sinus 07/09/2018 without any acute sinusitis.  No CT finding to explain reported jaw pain.  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    Headache:  - Acute on chronic, will get neurology consulted. He was supposed to see neurology as OP with his headache.   ???  Lactic acidosis: Resolved  - POC lactic acid 2.7 in the ED  - S/p 1L LR  - Repeat lactic acid normal   ???  Low TSH  - TSH 0.15  - normal FT4  ???  Hypertension  - Patient notes history of hypertension and takes one medication at home. He cannot recall this medication and is not on PTA med list.  - Monitor blood pressure, currently stable  ???  FEN: IVF, monitor lytes, Regular diet  Ppx: Lovenox  FULL CODE  Dispo: continue inpatient  -Anticipate discharge in AM if continues to not have fevers.     Complexity of decision making high. 35 minutes were spent with >50% time spent face to face or on floor.   ________________________________________________________________________    Subjective  Franklin Graves is a 33 y.o. male.  Patient reports feeling better today. Gum pain and swelling is improved as is improvement pain in his jaw.  Pain in right side of chest improved (Chronic). No fevers overnight.     Headache continues to be a problem, reported headache on left side of head, was supposed to see neurologist as OP through [employer]. Denies any weakness or numbness. HA did not improve with headache cocktail, just made him sleepy.     Denies any nausea vomiting diarrhea constipation blood in the stool or black stool.  Denies any increasing shortness of breath or palpitation. Denies any lightheadedness or denies.  Denies any rash.    Medications  Scheduled Meds:amoxicillin-potassium clavulanate (AUGMENTIN) tablet 875 mg, 875 mg, Oral, BID w/meals  enoxaparin (LOVENOX) syringe 40 mg, 40 mg, Subcutaneous, QDAY(21)  gabapentin (NEURONTIN) capsule 900 mg, 900 mg, Oral, TID  lidocaine (LIDODERM) 5 % topical patch 1 patch, 1 patch, Topical, QDAY    And  Verification of Patch Placement and Integrity - Lidocaine 5%, , Transdermal, BID  valACYclovir (VALTREX) tablet 1,000 mg, 1,000 mg, Oral, BID    Continuous Infusions:  ??? lactated ringers infusion Stopped (07/10/18 0516)     PRN and Respiratory Meds:acetaminophen Q6H PRN, diphenhydrAMINE/lidocaine/antacid (#) TID PRN, HYDROcodone/acetaminophen Q12H PRN, ondansetron (ZOFRAN) IV Q6H PRN        Objective:                          Vital Signs: Last Filed                 Vital Signs: 24 Hour Range   BP: 116/87 (04/15 1124)  Temp: 36.7 ???C (98 ???F) (04/15 1124)  Pulse: 93 (04/15 1124)  Respirations: 18 PER MINUTE (04/15 1124)  SpO2: 98 % (04/15 1124) BP: (116-147)/(76-93)   Temp:  [36.7 ???C (98 ???F)-37.6 ???C (99.6 ???F)]   Pulse:  [77-94]   Respirations:  [18 PER MINUTE]   SpO2:  [95 %-100 %]    Intensity Pain Scale (Self Report): 7 (07/10/18 0917)  Intensity Pain Scale (Self Report): 8 (07/09/18 1935) Vitals:    07/06/18 1419 07/08/18 1604   Weight: 90.3 kg (199 lb) 89.8 kg (198 lb)       Intake/Output Summary:  (Last 24 hours)    Intake/Output Summary (Last 24 hours) at 07/10/2018 1140  Last data filed at 07/10/2018 1036  Gross per 24 hour   Intake 3032 ml   Output 3900 ml   Net -868 ml      Stool Occurrence: 0    Physical Exam  General:  Alert, awake, appears to be in discomfort  Lungs:  Tachypneic, clear to auscultation bilaterally  Heart:   Tachycardic, S1, S2 normal, no murmur  Abdomen:  Soft, RUQ tenderness.  Bowel sounds normal.   Extremities: no edema   Neurologic:  Alert and oriented x 3, no focal deficits grossly    Lab Review  24-hour labs: Results for orders placed or performed during the hospital encounter of 07/06/18 (from the past 24 hour(s))   CBC AND DIFF    Collection Time: 07/10/18  4:12 AM   Result Value Ref Range    White Blood Cells 4.9 4.5 - 11.0 K/UL    RBC 4.85 4.4 - 5.5 M/UL    Hemoglobin 14.9 13.5 - 16.5 GM/DL    Hematocrit 81.1 40 - 50 %    MCV 89.6 80 - 100 FL    MCH 30.7 26 - 34 PG    MCHC 34.3 32.0 - 36.0 G/DL    RDW 91.4 11 - 15 %    Platelet Count 203 150 - 400 K/UL    MPV 7.5 7 - 11 FL    Neutrophils 55 41 - 77 %    Lymphocytes 29 24 - 44 %    Monocytes 14 (H) 4 - 12 %    Eosinophils 2 0 - 5 %    Basophils 0 0 - 2 %    Absolute Neutrophil Count 2.70 1.8 - 7.0 K/UL    Absolute Lymph Count 1.40 1.0 - 4.8 K/UL    Absolute Monocyte Count 0.70 0 - 0.80 K/UL    Absolute Eosinophil Count 0.10 0 - 0.45 K/UL    Absolute Basophil Count 0.00 0 - 0.20 K/UL   COMPREHENSIVE METABOLIC PANEL    Collection Time: 07/10/18  4:12 AM   Result Value Ref Range    Sodium 139 137 - 147 MMOL/L    Potassium 4.3 3.5 - 5.1 MMOL/L    Chloride 104 98 - 110 MMOL/L    Glucose 99 70 - 100 MG/DL    Blood Urea Nitrogen 8 7 - 25 MG/DL    Creatinine 7.82 0.4 - 1.24 MG/DL    Calcium 8.9 8.5 - 95.6 MG/DL    Total Protein 6.8 6.0 - 8.0 G/DL    Total Bilirubin 0.4 0.3 - 1.2 MG/DL    Albumin 3.5 3.5 - 5.0 G/DL    Alk Phosphatase 43 25 - 110 U/L    AST (SGOT) 16 7 - 40 U/L    CO2 28 21 - 30 MMOL/L    ALT (SGPT) 16 7 - 56 U/L    Anion Gap 7 3 - 12    eGFR Non African American >60 >60 mL/min    eGFR African American >60 >60 mL/min       Point of Care Testing  (Last 24 hours)  Glucose: 99 (07/10/18 0412)    Radiology and other Diagnostics Review:    Pertinent radiology reviewed.  Amado Coe, MD   Pager : [facility] Team pager [phone]

## 2018-07-11 ENCOUNTER — Encounter: Admit: 2018-07-11 | Discharge: 2018-07-11

## 2018-07-11 ENCOUNTER — Inpatient Hospital Stay: Admit: 2018-07-11 | Discharge: 2018-07-11

## 2018-07-11 DIAGNOSIS — I10 Essential (primary) hypertension: ICD-10-CM

## 2018-07-11 DIAGNOSIS — R0602 Shortness of breath: Secondary | ICD-10-CM

## 2018-07-11 DIAGNOSIS — R0781 Pleurodynia: ICD-10-CM

## 2018-07-11 DIAGNOSIS — M549 Dorsalgia, unspecified: Principal | ICD-10-CM

## 2018-07-11 DIAGNOSIS — D492 Neoplasm of unspecified behavior of bone, soft tissue, and skin: ICD-10-CM

## 2018-07-11 LAB — BASIC METABOLIC PANEL
Lab: 1.4 mg/dL — ABNORMAL HIGH (ref 0.4–1.24)
Lab: 106 MMOL/L (ref 98–110)
Lab: 12 mg/dL (ref 7–25)
Lab: 139 MMOL/L (ref 137–147)
Lab: 24 MMOL/L (ref 21–30)
Lab: 3.8 MMOL/L (ref 3.5–5.1)
Lab: 58 mL/min — ABNORMAL LOW (ref >60)
Lab: 8.5 mg/dL (ref 8.5–10.6)
Lab: 92 mg/dL (ref 70–100)
Lab: >60 mL/min (ref >60)

## 2018-07-11 LAB — CBC AND DIFF: Lab: 5.7 10*3/uL — ABNORMAL HIGH (ref >60)

## 2018-07-11 LAB — GLUCOSE-CSF: Lab: 60 mg/dL (ref 40–75)

## 2018-07-11 LAB — LEUKEMIA/LYMPHOMA PANEL FLUID/TISSUE

## 2018-07-11 LAB — POC GLUCOSE: Lab: 83 mg/dL (ref 70–100)

## 2018-07-11 LAB — TOTAL PROTEIN-CSF: Lab: 31 mg/dL (ref 15–45)

## 2018-07-11 LAB — COMPREHENSIVE METABOLIC PANEL: Lab: 140 MMOL/L — ABNORMAL LOW (ref >60)

## 2018-07-11 MED ORDER — AMOXICILLIN-POT CLAVULANATE 875-125 MG PO TAB
1 | ORAL_TABLET | Freq: Two times a day (BID) | ORAL | 0 refills | 7.00000 days | Status: DC
Start: 2018-07-11 — End: 2018-07-12

## 2018-07-11 MED ORDER — VALACYCLOVIR 1 GRAM PO TAB
1000 mg | ORAL_TABLET | Freq: Two times a day (BID) | ORAL | 0 refills | Status: DC
Start: 2018-07-11 — End: 2018-07-12
  Filled 2018-07-16: qty 11, 6d supply, fill #1

## 2018-07-11 MED ORDER — NORTRIPTYLINE 25 MG PO CAP
25 mg | ORAL_CAPSULE | Freq: Every evening | ORAL | 0 refills | Status: AC
Start: 2018-07-11 — End: ?
  Filled 2018-07-16 (×2): qty 60, 60d supply, fill #1

## 2018-07-11 NOTE — Progress Notes
Infectious Diseases Progress Note    Today's Date:  07/11/2018  Admission Date: 07/06/2018    Reason for this consultation: Fevers, dyspnea. CoVID neg    Assessment:     Fever, cervical adenopathy, lymphopenia  COVID-19 neg x 2, monospot and rapid strep neg  BC 4/11 NTD  Periph smear neg  EBV panel (old infection), CMV IgM neg, HIV and syph ab neg  Potentially secondary to viral infection (HSV) as per below  improved    Severe gingivostomatitis - HSV +/- other  Unremarkable panorex  No seizure meds  Hasn't brushed teeth all week due to pain  Lymphopenia/Cervical LN - leukemia /lymphoma? (no cell lines down, no prior B symptoms), periph smear neg  Spleen nml  Sl improved on empiric treatment    R chest wall/RUQ pain, liver with steatosis, no other notable finding on [place]  Improved    HTN    Mild AKI?    H/o spinal tumor (schwannoma) s/p resection  Complicated by ptx s/p CT    Recommendations:     1. Continue valacyclovir and augmentin; tentatively 10 day course  2. Monitor for recurrence of fever   3. Reviewed LP - nml wbc, protein not consistent with meningitis  4. Creat sl up - plan is to repeat it; cont to encourage fluids - at least 64 oz water daily    Given the LN is near resolved no need to consider biopsy at this time - FU with PCP in a few weeks - discussed this w patient.  No need for outpt ID FU    History of Present Illness    Franklin Graves is a 33 y.o. with PMH of HTN and chew tobacco use, benign thoracic spine mass that was removed but with chronic back/R thoracic pain who presented to the ED with worsening SOB on 07/06/2018.  Afeb, vss  HA improved; no neck pain   Jaw pain and gum pain better  Still some R sided aching but improved  Slept well   No n/v    Antimicrobial Start date End date   Rocephin/doxy 4/12 4/13   augmentin 4/13    valtrex 4/13                        Estimated Creatinine Clearance: 88.1 mL/min (A) (based on SCr of 1.5 mg/dL (H)).    Medications

## 2018-07-11 NOTE — Case Management (ED)
Case Management Progress Note    NAME:Franklin Graves                          MRN: [id number]              DOB:05-Nov-1985          AGE: 33 y.o.  ADMISSION DATE: 07/06/2018             DAYS ADMITTED: LOS: 5 days      Today???s Date: 07/11/2018    Plan  Patient will discharge home when medically stable.       Interventions  ? Support      ? Info or Referral      ? Discharge Planning  ?   -Patient's case manager Dru [phone] from [place] contacted NCM for update. NCM updated Dru and answered questions.   ?   ? Medication Needs          ? Financial      ? Legal      ? Other        Disposition  ? Expected Discharge Date    Expected Discharge Date: 07/12/18  Expected Discharge Time: 1200  ? Transportation   Does the patient need discharge transport arranged?: No  Transportation Name, Phone and Availability #1: Pts family will transport at D/C  Does the patient use Medicaid Transportation?: No  ? Next Level of Care (Acute Psych discharges only)      ? Discharge Disposition     Durable Medical Equipment      No service has been selected for the patient.      [place] [facility]      No service has been selected for the patient.      [facility]      No service has been selected for the patient.      [facility]      No service has been selected for the patient.        Laqueta Carina BSN, RN, CPN  Nurse Case Manager   Pager [phone]

## 2018-07-11 NOTE — Care Plan
Problem: Discharge Planning  Goal: Participation in plan of care  Outcome: Goal Ongoing  Goal: Knowledge regarding plan of care  Outcome: Goal Ongoing  Goal: Prepared for discharge  Outcome: Goal Ongoing     Problem: Infection, Risk of  Goal: Absence of infection  Outcome: Goal Ongoing  Goal: Knowledge of Infection Control Procedures  Outcome: Goal Ongoing     Problem: Pain  Goal: Management of pain  Outcome: Goal Ongoing  Goal: Knowledge of pain management  Outcome: Goal Ongoing  Goal: Progress Toward Pain Management Goals  Outcome: Goal Ongoing

## 2018-07-11 NOTE — Progress Notes
well as his lesions in the mouth or crusting.  No further jaw pain.  His headache is resolved, reported secondary to nortriptyline.  Pain in right side of chest improved (Chronic). No fevers overnight.     Denies any nausea vomiting diarrhea constipation blood in the stool or black stool.  Denies any increasing shortness of breath or palpitation.  Denies any lightheadedness or denies.  Denies any rash.    Medications  Scheduled Meds:amoxicillin-potassium clavulanate (AUGMENTIN) tablet 875 mg, 875 mg, Oral, BID w/meals  enoxaparin (LOVENOX) syringe 40 mg, 40 mg, Subcutaneous, QDAY(21)  gabapentin (NEURONTIN) capsule 900 mg, 900 mg, Oral, TID  lidocaine (LIDODERM) 5 % topical patch 1 patch, 1 patch, Topical, QDAY    And  Verification of Patch Placement and Integrity - Lidocaine 5%, , Transdermal, BID  nortriptyline (PAMELOR) capsule 25 mg, 25 mg, Oral, QHS  valACYclovir (VALTREX) tablet 1,000 mg, 1,000 mg, Oral, BID    Continuous Infusions:  ??? lactated ringers infusion 100 mL/hr at 07/11/18 0319     PRN and Respiratory Meds:acetaminophen Q6H PRN, diphenhydrAMINE/lidocaine/antacid (#) TID PRN, HYDROcodone/acetaminophen Q12H PRN, ondansetron (ZOFRAN) IV Q6H PRN        Objective:                          Vital Signs: Last Filed                 Vital Signs: 24 Hour Range   BP: 151/95 (04/16 1114)  Temp: 36.4 ???C (97.6 ???F) (04/16 1114)  Pulse: 85 (04/16 1114)  Respirations: 17 PER MINUTE (04/16 1114)  SpO2: 97 % (04/16 1114)  SpO2 Pulse: 74 (04/16 0930) BP: (108-151)/(75-97)   Temp:  [36.4 ???C (97.6 ???F)-37 ???C (98.6 ???F)]   Pulse:  [74-91]   Respirations:  [17 PER MINUTE-23 PER MINUTE]   SpO2:  [93 %-98 %]      Vitals:    07/06/18 1419 07/08/18 1604   Weight: 90.3 kg (199 lb) 89.8 kg (198 lb)       Intake/Output Summary:  (Last 24 hours)    Intake/Output Summary (Last 24 hours) at 07/11/2018 1307  Last data filed at 07/11/2018 1115  Gross per 24 hour   Intake 2149 ml   Output 1000 ml   Net 1149 ml

## 2018-07-12 ENCOUNTER — Inpatient Hospital Stay: Admit: 2018-07-06 | Discharge: 2018-07-12 | Disposition: A | Discharge status: Home / Self Care

## 2018-07-12 ENCOUNTER — Inpatient Hospital Stay: Admit: 2018-07-08 | Discharge: 2018-07-08

## 2018-07-12 ENCOUNTER — Encounter: Admit: 2018-07-12 | Discharge: 2018-07-12

## 2018-07-12 ENCOUNTER — Inpatient Hospital Stay: Admit: 2018-07-09 | Discharge: 2018-07-09

## 2018-07-12 DIAGNOSIS — G43909 Migraine, unspecified, not intractable, without status migrainosus: ICD-10-CM

## 2018-07-12 DIAGNOSIS — B349 Viral infection, unspecified: Principal | ICD-10-CM

## 2018-07-12 DIAGNOSIS — K760 Fatty (change of) liver, not elsewhere classified: ICD-10-CM

## 2018-07-12 DIAGNOSIS — R6884 Jaw pain: ICD-10-CM

## 2018-07-12 DIAGNOSIS — M546 Pain in thoracic spine: ICD-10-CM

## 2018-07-12 DIAGNOSIS — R590 Localized enlarged lymph nodes: ICD-10-CM

## 2018-07-12 DIAGNOSIS — B002 Herpesviral gingivostomatitis and pharyngotonsillitis: ICD-10-CM

## 2018-07-12 DIAGNOSIS — R000 Tachycardia, unspecified: ICD-10-CM

## 2018-07-12 DIAGNOSIS — K0510 Chronic gingivitis, plaque induced: ICD-10-CM

## 2018-07-12 DIAGNOSIS — E876 Hypokalemia: ICD-10-CM

## 2018-07-12 DIAGNOSIS — R0781 Pleurodynia: ICD-10-CM

## 2018-07-12 DIAGNOSIS — F17220 Nicotine dependence, chewing tobacco, uncomplicated: ICD-10-CM

## 2018-07-12 DIAGNOSIS — R6510 Systemic inflammatory response syndrome (SIRS) of non-infectious origin without acute organ dysfunction: ICD-10-CM

## 2018-07-12 DIAGNOSIS — Z87820 Personal history of traumatic brain injury: ICD-10-CM

## 2018-07-12 DIAGNOSIS — Z885 Allergy status to narcotic agent status: ICD-10-CM

## 2018-07-12 DIAGNOSIS — E872 Acidosis: ICD-10-CM

## 2018-07-12 DIAGNOSIS — I10 Essential (primary) hypertension: ICD-10-CM

## 2018-07-12 DIAGNOSIS — G4452 New daily persistent headache (NDPH): ICD-10-CM

## 2018-07-12 DIAGNOSIS — D72810 Lymphocytopenia: ICD-10-CM

## 2018-07-12 LAB — CELL COUNT W/DIFF-CSF: Lab: 0 /uL (ref <5)

## 2018-07-12 LAB — CREATININE-URINE RANDOM: Lab: 65 mg/dL

## 2018-07-12 LAB — CULTURE-BLOOD W/SENSITIVITY

## 2018-07-12 LAB — SODIUM-URINE RANDOM: Lab: 54 MMOL/L

## 2018-07-12 MED ORDER — VALACYCLOVIR 1 GRAM PO TAB
1000 mg | ORAL_TABLET | Freq: Two times a day (BID) | ORAL | 0 refills | Status: DC
Start: 2018-07-12 — End: 2018-07-12
  Filled 2018-07-11: qty 14, 7d supply
  Filled 2018-07-16: qty 11, 6d supply, fill #1

## 2018-07-12 MED ORDER — AMOXICILLIN-POT CLAVULANATE 875-125 MG PO TAB
1 | ORAL_TABLET | Freq: Two times a day (BID) | ORAL | 0 refills | 7.00000 days | Status: DC
Start: 2018-07-12 — End: 2019-06-10

## 2018-07-12 MED ORDER — AMOXICILLIN-POT CLAVULANATE 875-125 MG PO TAB
1 | ORAL_TABLET | Freq: Two times a day (BID) | ORAL | 0 refills | 7.00000 days | Status: DC
Start: 2018-07-12 — End: 2018-07-12
  Filled 2018-07-11: qty 14, 7d supply
  Filled 2018-07-16 (×2): qty 11, 6d supply, fill #1

## 2018-07-12 MED ORDER — VALACYCLOVIR 1 GRAM PO TAB
1000 mg | ORAL_TABLET | Freq: Two times a day (BID) | ORAL | 0 refills | Status: AC
Start: 2018-07-12 — End: ?

## 2018-07-12 NOTE — Case Management (ED)
Case Management Progress Note    NAME:Donshay Dao Memmott                          MRN: [id number]              DOB:1985/06/23          AGE: 33 y.o.  ADMISSION DATE: 07/06/2018             DAYS ADMITTED: LOS: 6 days      Today???s Date: 07/12/2018    Plan  Patient will discharge home today.     Interventions  ? Support      ? Info or Referral      ? Discharge Planning    -NCM reviewed EMR and discussed with [facility] team.    -No needs identified.   -NCM updated Dru NCM with [facility] patient will discharge home today.     ? Medication Needs       ? Financial      ? Legal      ? Other        Disposition  ? Expected Discharge Date    Expected Discharge Date: 07/12/18  Expected Discharge Time: 1200  ? Transportation   Does the patient need discharge transport arranged?: No  Transportation Name, Phone and Availability #1: Pts family will transport at D/C  Does the patient use Medicaid Transportation?: No  ? Next Level of Care (Acute Psych discharges only)      ? Discharge Disposition     Durable Medical Equipment      No service has been selected for the patient.      [place] [facility]      No service has been selected for the patient.      [facility]      No service has been selected for the patient.      [facility]      No service has been selected for the patient.        Laqueta Carina BSN, RN, CPN  Nurse Case Manager   Pager [phone]'

## 2018-07-12 NOTE — Progress Notes
Discharge day progress note    Patient seen and examined. NO overnight events. No headache. Oral sores are improving.     VSS;  Exam: Lungs CTAB. Abdomen: soft, NT    Labs: reviewed.     Dispo:  - Discharge patient home today.   - Changed Augmentin/ Valtrax to 6 days from 7 days.   - NO other changes.   - Encouraged PO hydration at home.     35 minutes were spent in discharge planning and coordinating discharge.     Shelba Flake, MD  [phone]

## 2018-07-15 ENCOUNTER — Encounter: Admit: 2018-07-15 | Discharge: 2018-07-15

## 2018-07-15 LAB — HERPES SIMPLEX PCR - NON-BLOOD

## 2018-07-16 ENCOUNTER — Encounter: Admit: 2018-07-16 | Discharge: 2018-07-16

## 2018-08-23 ENCOUNTER — Encounter: Admit: 2018-08-23 | Discharge: 2018-08-23

## 2019-05-14 ENCOUNTER — Encounter: Admit: 2019-05-14 | Discharge: 2019-05-14

## 2019-05-20 ENCOUNTER — Ambulatory Visit: Admit: 2019-05-20 | Discharge: 2019-05-20

## 2019-05-20 ENCOUNTER — Encounter: Admit: 2019-05-20 | Discharge: 2019-05-20

## 2019-05-20 DIAGNOSIS — I10 Essential (primary) hypertension: Secondary | ICD-10-CM

## 2019-05-20 DIAGNOSIS — G8918 Other acute postprocedural pain: Secondary | ICD-10-CM

## 2019-05-20 DIAGNOSIS — R0781 Pleurodynia: Secondary | ICD-10-CM

## 2019-05-20 DIAGNOSIS — M549 Dorsalgia, unspecified: Secondary | ICD-10-CM

## 2019-05-20 DIAGNOSIS — D492 Neoplasm of unspecified behavior of bone, soft tissue, and skin: Secondary | ICD-10-CM

## 2019-05-20 LAB — CBC AND DIFF
Lab: 0 10*3/uL (ref 0–0.20)
Lab: 0.1 K/UL (ref 0–0.45)
Lab: 0.4 10*3/uL (ref 0–0.80)
Lab: 1 % (ref 0–2)
Lab: 1.5 K/UL (ref 1.0–4.8)
Lab: 13 % (ref 11–15)
Lab: 16 g/dL (ref 13.5–16.5)
Lab: 240 10*3/uL (ref 150–400)
Lab: 3 10*3/uL (ref 1.8–7.0)
Lab: 30 % (ref 24–44)
Lab: 30 pg (ref 26–34)
Lab: 33 g/dL (ref 32.0–36.0)
Lab: 4 % (ref 0–5)
Lab: 47 % (ref 40–50)
Lab: 5.2 10*3/uL (ref 4.5–11.0)
Lab: 5.2 M/UL (ref 4.4–5.5)
Lab: 57 % (ref 41–77)
Lab: 8 % (ref 4–12)
Lab: 8.2 FL (ref 7–11)
Lab: 91 FL (ref 80–100)

## 2019-05-20 LAB — SED RATE: Lab: <1 mm/h (ref 0–15)

## 2019-05-20 LAB — C REACTIVE PROTEIN (CRP): Lab: 0.1 mg/dL (ref <1.0)

## 2019-05-20 NOTE — Patient Instructions
Please do not hesitate to contact my office with any questions.    Dr. Bryan Vopat & Stephanie Caldwell PA-C - Orthopedic Surgeon, Sports Medicine  [facility] - Phone [phone] - Fax [phone]   [address], [address] - [place], [place] [address]    Midge Momon BSN, RN - Clinical Nurse Coordinator  Casey Conover BSN, RN - Clinical Nurse Coordinator  Megan Burki MS, ATC, LAT - Clinical Athletic Trainer    Thank you for supporting our practice! Your feedback helps us deliver the highest quality of care.  Review us at: [email or web address]

## 2019-05-21 ENCOUNTER — Encounter: Admit: 2019-05-21 | Discharge: 2019-05-21

## 2019-05-22 ENCOUNTER — Encounter: Admit: 2019-05-22 | Discharge: 2019-05-22

## 2019-05-22 ENCOUNTER — Ambulatory Visit: Admit: 2019-05-22 | Discharge: 2019-05-22

## 2019-05-22 DIAGNOSIS — T8142XA Infection following a procedure, deep incisional surgical site, initial encounter: Secondary | ICD-10-CM

## 2019-05-22 DIAGNOSIS — D3613 Benign neoplasm of peripheral nerves and autonomic nervous system of lower limb, including hip: Secondary | ICD-10-CM

## 2019-05-22 DIAGNOSIS — Z20822 Encounter for screening laboratory testing for COVID-19 virus in asymptomatic patient: Secondary | ICD-10-CM

## 2019-05-22 MED ORDER — CEFAZOLIN 1 GRAM IJ SOLR
2 g | Freq: Once | INTRAVENOUS | 0 refills | Status: CN
Start: 2019-05-22 — End: ?

## 2019-06-10 ENCOUNTER — Encounter: Admit: 2019-06-10 | Discharge: 2019-06-10

## 2019-06-10 DIAGNOSIS — D492 Neoplasm of unspecified behavior of bone, soft tissue, and skin: Secondary | ICD-10-CM

## 2019-06-10 DIAGNOSIS — R0781 Pleurodynia: Secondary | ICD-10-CM

## 2019-06-10 DIAGNOSIS — I10 Essential (primary) hypertension: Secondary | ICD-10-CM

## 2019-06-10 DIAGNOSIS — M549 Dorsalgia, unspecified: Secondary | ICD-10-CM

## 2019-06-16 ENCOUNTER — Encounter: Admit: 2019-06-16 | Discharge: 2019-06-17

## 2019-06-16 DIAGNOSIS — Z20822 Encounter for screening laboratory testing for COVID-19 virus in asymptomatic patient: Secondary | ICD-10-CM

## 2019-06-18 ENCOUNTER — Encounter: Admit: 2019-06-18 | Discharge: 2019-06-18

## 2019-06-18 DIAGNOSIS — D492 Neoplasm of unspecified behavior of bone, soft tissue, and skin: Secondary | ICD-10-CM

## 2019-06-18 DIAGNOSIS — M549 Dorsalgia, unspecified: Secondary | ICD-10-CM

## 2019-06-18 DIAGNOSIS — R0781 Pleurodynia: Secondary | ICD-10-CM

## 2019-06-18 DIAGNOSIS — I10 Essential (primary) hypertension: Secondary | ICD-10-CM

## 2019-06-18 MED ORDER — FENTANYL CITRATE (PF) 50 MCG/ML IJ SOLN
50 ug | INTRAVENOUS | 0 refills | Status: DC | PRN
Start: 2019-06-18 — End: 2019-06-20

## 2019-06-18 MED ORDER — MIDAZOLAM 1 MG/ML IJ SOLN
INTRAVENOUS | 0 refills | Status: DC
Start: 2019-06-18 — End: 2019-06-18

## 2019-06-18 MED ORDER — PROPOFOL INJ 10 MG/ML IV VIAL
0 refills | Status: DC
Start: 2019-06-18 — End: 2019-06-18

## 2019-06-18 MED ORDER — GLYCOPYRROLATE 0.2 MG/ML IJ SOLN
.2 mg | Freq: Once | INTRAVENOUS | 0 refills | Status: AC | PRN
Start: 2019-06-18 — End: ?

## 2019-06-18 MED ORDER — ONDANSETRON HCL (PF) 4 MG/2 ML IJ SOLN
INTRAVENOUS | 0 refills | Status: DC
Start: 2019-06-18 — End: 2019-06-18

## 2019-06-18 MED ORDER — MEPERIDINE (PF) 25 MG/ML IJ SYRG
12.5 mg | INTRAVENOUS | 0 refills | Status: DC | PRN
Start: 2019-06-18 — End: 2019-06-20

## 2019-06-18 MED ORDER — FENTANYL CITRATE (PF) 50 MCG/ML IJ SOLN
25 ug | INTRAVENOUS | 0 refills | Status: DC | PRN
Start: 2019-06-18 — End: 2019-06-20

## 2019-06-18 MED ORDER — DEXMEDETOMIDINE IN 0.9 % NACL 80 MCG/20 ML (4 MCG/ML) IV SOLN
0 refills | Status: DC
Start: 2019-06-18 — End: 2019-06-18

## 2019-06-18 MED ORDER — LACTATED RINGERS IV SOLP
INTRAVENOUS | 0 refills | Status: DC
Start: 2019-06-18 — End: 2019-06-23

## 2019-06-18 MED ORDER — CEFAZOLIN 1 GRAM IJ SOLR
2 g | Freq: Once | INTRAVENOUS | 0 refills | Status: CP
Start: 2019-06-18 — End: ?

## 2019-06-18 MED ORDER — HYDROCODONE-ACETAMINOPHEN 5-325 MG PO TAB
1-2 | ORAL_TABLET | ORAL | 0 refills | 30.00000 days | Status: AC | PRN
Start: 2019-06-18 — End: ?

## 2019-06-18 MED ORDER — ONDANSETRON HCL (PF) 4 MG/2 ML IJ SOLN
4 mg | Freq: Once | INTRAVENOUS | 0 refills | Status: AC | PRN
Start: 2019-06-18 — End: ?

## 2019-06-18 MED ORDER — PROMETHAZINE 25 MG/ML IJ SOLN
6.25 mg | INTRAVENOUS | 0 refills | Status: DC | PRN
Start: 2019-06-18 — End: 2019-06-23

## 2019-06-18 MED ORDER — LIDOCAINE (PF) 200 MG/10 ML (2 %) IJ SYRG
0 refills | Status: DC
Start: 2019-06-18 — End: 2019-06-18

## 2019-06-18 MED ORDER — DOCUSATE SODIUM 100 MG PO CAP
100 mg | ORAL_CAPSULE | Freq: Two times a day (BID) | ORAL | 0 refills | Status: AC
Start: 2019-06-18 — End: ?

## 2019-06-18 MED ORDER — ROPIVACAINE (PF) 5 MG/ML (0.5 %) IJ SOLN
0 refills | Status: DC
Start: 2019-06-18 — End: 2019-06-18

## 2019-06-18 MED ORDER — ONDANSETRON HCL 4 MG PO TAB
4 mg | ORAL_TABLET | ORAL | 0 refills | 8.00000 days | Status: DC | PRN
Start: 2019-06-18 — End: 2019-08-05

## 2019-06-18 MED ORDER — ASPIRIN 81 MG PO TBEC
81 mg | ORAL_TABLET | Freq: Every day | ORAL | 0 refills | Status: DC
Start: 2019-06-18 — End: 2019-08-05

## 2019-06-18 MED ORDER — ONDANSETRON 4 MG PO TBDI
4 mg | Freq: Once | ORAL | 0 refills | Status: AC | PRN
Start: 2019-06-18 — End: ?

## 2019-06-18 MED ORDER — DEXAMETHASONE SODIUM PHOSPHATE 4 MG/ML IJ SOLN
INTRAVENOUS | 0 refills | Status: DC
Start: 2019-06-18 — End: 2019-06-18

## 2019-06-18 MED ORDER — HALOPERIDOL LACTATE 5 MG/ML IJ SOLN
1 mg | Freq: Once | INTRAVENOUS | 0 refills | Status: AC | PRN
Start: 2019-06-18 — End: ?

## 2019-06-18 MED ORDER — LIDOCAINE (PF) 10 MG/ML (1 %) IJ SOLN
.1-2 mL | INTRAMUSCULAR | 0 refills | Status: DC | PRN
Start: 2019-06-18 — End: 2019-06-23

## 2019-06-18 MED ORDER — FENTANYL CITRATE (PF) 50 MCG/ML IJ SOLN
0 refills | Status: DC
Start: 2019-06-18 — End: 2019-06-18

## 2019-06-18 MED ORDER — SODIUM CHLORIDE 0.9 % IR SOLN
0 refills | Status: DC
Start: 2019-06-18 — End: 2019-06-23

## 2019-06-18 NOTE — Anesthesia Procedure Notes
Anesthesia Procedure: Peripheral Nerve Block    PERIPHERAL NERVE BLOCK    Date/Time: 06/18/2019 1:25 PM    Patient location: post-op  Reason for block: post-op pain management and procedure for pain    Preprocedure checklist performed: 2 patient identifiers, risks & benefits discussed, patient evaluated, timeout performed, consent obtained, patient being monitored and sterile drape    Sterile technique:  - Proper hand washing  - Cap, mask  - Sterile gloves  - Skin prep for antisepsis        Peripheral Nerve Block Procedure   Patient position: supine  Prep: ChloraPrep    Monitoring: BP, EKG and continuous pulse ox  Block type: popliteal  Laterality: right  Injection technique: single-shot  Procedures: ultrasound guided      Needle/cathether:      Needle type: Stimuplex      Needle gauge: 22 G; Needle length: 4 in     Needle location: anatomical landmarks and ultrasound guidance    Procedure Medications    Bolus Dose: ropivacaine (PF) 0.5% (NAROPIN) injection, 30 mL    Procedure Outcome   Injection assessment: negative aspiration for heme, no paresthesia on injection, incremental injection and local visualized surrounding nerve on ultrasound  Observations: adequate block, patient sedated but conversant throughout block, patient tolerated the procedure well with no immediate complications and comfortable throughout block      Refer to nursing documentation for vitals and monitoring data during procedure.    Performed by: Thermon Leyland, MD  Authorized by: Markham Jordan, MD

## 2019-06-18 NOTE — Anesthesia Post-Procedure Evaluation
Post-Anesthesia Evaluation    Name: Franklin Graves      MRN: [id number]     DOB: 1985/09/21     Age: 34 y.o.     Sex: male   __________________________________________________________________________     Procedure Information     Anesthesia Start Date/Time: 06/18/19 1051    Procedures:       ARTHROSCOPY ANKLE WITH EXTENSIVE DEBRIDEMENT - SURGICAL W/ CULTURES (Right Ankle)      REMOVAL HARDWARE - DEEP - LOWER EXTREMITY  8 SCREWS REMOVED FROM RIGHT ANKLE (Right Ankle)      DEBRIDEMENT WOUND DEEP 20 SQ CM OR LESS - ankle (Right Ankle)      EXCISION NEUROMA HAND/ FOOT - EXCEPT DIGITAL NERVE W/ AMNIOTIC TISSUE (Right Ankle)    Location: [facility] / [facility] OR    Surgeons: Ezekiel Ina, MD          Post-Anesthesia Vitals  BP: 117/90 (03/24 1415)  Temp: 36.4 C (97.6 F) (03/24 1307)  Pulse: 94 (03/24 1415)  Respirations: 16 PER MINUTE (03/24 1415)  SpO2: 90 % (03/24 1415)  SpO2 Pulse: 92 (03/24 1415)  Height: 180.3 cm (71") (03/24 0834)   Vitals Value Taken Time   BP 117/90 06/18/19 1415   Temp 36.4 C (97.6 F) 06/18/19 1307   Pulse 94 06/18/19 1415   Respirations 16 PER MINUTE 06/18/19 1415   SpO2 90 % 06/18/19 1415         Post Anesthesia Evaluation Note    Evaluation location: pre/post  Patient participation: recovered; patient participated in evaluation  Level of consciousness: alert    Pain score: 4  Pain management: adequate    Hydration: normovolemia  Temperature: 36.0C - 38.4C  Airway patency: adequate    Perioperative Events       Post-op nausea and vomiting: no PONV    Postoperative Status  Cardiovascular status: hemodynamically stable  Respiratory status: spontaneous ventilation  Follow-up needed: none        Perioperative Events  Perioperative Event: No  Emergency Case Activation: No

## 2019-06-19 ENCOUNTER — Encounter: Admit: 2019-06-19 | Discharge: 2019-06-19

## 2019-06-19 DIAGNOSIS — M549 Dorsalgia, unspecified: Secondary | ICD-10-CM

## 2019-06-19 DIAGNOSIS — R0781 Pleurodynia: Secondary | ICD-10-CM

## 2019-06-19 DIAGNOSIS — I10 Essential (primary) hypertension: Secondary | ICD-10-CM

## 2019-06-19 DIAGNOSIS — D492 Neoplasm of unspecified behavior of bone, soft tissue, and skin: Secondary | ICD-10-CM

## 2019-06-19 NOTE — Telephone Encounter
Post-operative packet and surgery photos received from Dr. Voapt and Stephanie Caldwell PA-C. Email sent to Pt/patient family including photos and post operative instructions.

## 2019-06-20 ENCOUNTER — Encounter: Admit: 2019-06-20 | Discharge: 2019-06-20

## 2019-06-20 DIAGNOSIS — T847XXA Infection and inflammatory reaction due to other internal orthopedic prosthetic devices, implants and grafts, initial encounter: Secondary | ICD-10-CM

## 2019-06-20 MED ORDER — LINEZOLID 600 MG PO TAB
600 mg | ORAL_TABLET | Freq: Two times a day (BID) | ORAL | 0 refills | Status: DC
Start: 2019-06-20 — End: 2019-08-05

## 2019-06-20 NOTE — Progress Notes
Contacted by Dr. Alba Cory regarding Franklin Graves.  Patient with hardware infection.  He was taken to the OR 06/18/2019 and underwent removal of the hardware from his right ankle.  Screw holes debrided thoroughly.  Culture results thus far growing MSSA + group B streptococcus.    We agree with IV antimicrobial therapy.  Patient would likely benefit from 4-6-week course of IV cefazolin 2 g every 8 hours.  Today is Friday 3/26.  Unable to get PICC placed this afternoon.  Called and spoke with the patient.  We will start linezolid 600 mg p.o. twice daily and give him a 5-day supply.  This will allow [place] time to get him in for PICC (or midline) placement into his arm early next week.  We will try to schedule a visit with [place] in clinic on that same day, perhaps 3/29 or 3/30.  We will not have to give him the first dose following placement of the IV as he had cefazolin perioperatively at [facility].

## 2019-06-20 NOTE — Telephone Encounter
Per Dr. Cletus Gash, this pt needs to start IV abx's and get a picc/central line placed to treat hardware infection of right ankle.     Benefit Check sent to [facility] for Cefazolin 2g IV Q 8 hrs x 4-6 wks.     Pt called, discussed home infusion and cost with home health visits for labs and picc care. Pt agreeable to out of pocket cost if any. Pt would like HH.      Teaching done on process of getting started on IV abx, getting picc placeed and care required to maintain central line. Signs and symptoms of an allergic reactions and potential side effects due to the IV abx prescribed also discussed.      Orders for picc line placement entered into 02   Pt agreeable to Appointment 3/30 at 10am with IV team for SL Picc placement. ID RN's contact info provided in case of questions or concerns.      Pt verbalized understanding and ability to learn how to administer IV antibiotic and maintain central line. Also discussed signs and symptoms of line related thrombus and to call us immediately if pt starts to experience redness, warmth, pain, or increased swelling to picc line arm or other central line site.      The patient is aware that potential line related complications include but are not limited to thrombus and infection.    The patient was counseled to avoid line contamination by keeping it clean and dry. Also counseled on not lifting anything over 5 lbs and importance of flushing line daily.    The patient was counseled to call our office during normal business hours or after hours/ weekends or holiday's the main hospital number (then ask for the ID physician on call) with any concerns related to the PICC/CVC or the associated antimicrobials.  The patient counseled regarding home health nurses role and visits and importance of pt being available for home visits.

## 2019-06-23 MED ORDER — CEFAZOLIN 1 GRAM IJ SOLR
2 g | INTRAVENOUS | 0 refills | 8.00000 days | Status: DC
Start: 2019-06-23 — End: 2019-07-31

## 2019-06-23 NOTE — Progress Notes
Reason for consultation:   Infected hardware        History of Present Illness:       Dear Dr. Essie Christine,  I had the pleasure of seeing Franklin Graves in the Adult ID clinic today.    As you recall, he is a pleasant 34 year old gentleman with a history of spinal tumor (schwannoma) status post resection, hypertension and history of right distal fibula fracture.    He is active duty Electronics engineer.  He sustained a right distal fibula fracture in July 2020.  He does not recall a particular injury when he developed this fracture.  He did undergo ORIF of the right distal fibula by Dr. Aundria Rud on 09/26/2018.  He reported some redness, swelling and drainage approximately 2 and half weeks following the procedure.  He received oral doxycycline postoperatively.  He was diagnosed with a staphylococcal infection at that time.  Unfortunately, he developed progressive pain particularly over the lateral aspect of the ankle.    He was sent for evaluation at [facility] clinic on 05/20/2019 for a second opinion..  At that time.  He had significant swelling of his ankle especially after being on his feet during the day.  He was unable to run and perform all of his usual duties at work.    Outside films (02/17/2019) were reviewed.  This revealed healing of the distal fibula fracture.  There was some evidence of lucency about the plate.  On review of the film, do not appreciate substantial lucency about the screws.    Labs (2/23): WBC = 5.2; ESR <1; CRP 0.14    Patient was taken to the OR 06/18/2019.  Extensive soft tissue impingement anteriorly.  This was debrided back.  Incision was taken down to the plate.  Cultures were required.  Right ankle hardware was removed and the ankle was debrided.    Operative cultures (3/23): Group B streptococcus + MSSA.  Anaerobic and fungal cultures remain no growth thus far.    We were contacted about Franklin Graves on 3/26.  We contacted the patient.  We started him on linezolid understanding it would take some time to arrange IV antibiotics.  He began linezolid 600 mg p.o. twice daily.  He presents to clinic today for evaluation.        Review of systems:  As outlined above.  Otherwise, a comprehensive 14-point ROS was negative.  (Films were reviewed)    Medical History:   Diagnosis Date   ? Back pain    ? Hypertension    ? Rib pain    ? Thoracic spine tumor      Surgical History:   Procedure Laterality Date   ? SINUS SURGERY  1995   ? BUNIONECTOMY  2013   ? APPENDECTOMY  03/26/12   ? LASIK  05/13/15   ? RIGHT VIDEO ASSISTED THORACOSCOPY, RIGHT T8-T9 TUMOR EXCISION Right 08/17/2015    Performed by Bryson Dames, MD at [facility]   ? ESOPHAGOGASTRODUODENOSCOPY WITH SPECIMEN COLLECTION BY BRUSHING/ WASHING N/A 09/12/2017    Performed by Eliott Nine, MD at [facility] OR   ? ARTHROSCOPY ANKLE WITH EXTENSIVE DEBRIDEMENT - SURGICAL W/ CULTURES Right 06/18/2019    Performed by Tanja Port, MD at [facility] OR   ? REMOVAL HARDWARE - DEEP - LOWER EXTREMITY  8 SCREWS REMOVED FROM RIGHT ANKLE Right 06/18/2019    Performed by Vopat, Lowry Ram, MD at [facility] OR   ? DEBRIDEMENT WOUND DEEP 20 SQ CM OR  LESS - ankle Right 06/18/2019    Performed by Vopat, Lowry Ram, MD at [facility] OR   ? EXCISION NEUROMA HAND/ FOOT - EXCEPT DIGITAL NERVE W/ AMNIOTIC TISSUE Right 06/18/2019    Performed by Tanja Port, MD at [facility] OR   ? TONSIL AND ADENOIDECTOMY     ? WISDOM TEETH EXTRACTION         Social Hx:  Lives in [place].  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     reports that he has never smoked. His smokeless tobacco use includes chew. He reports current alcohol use of about 12.0 standard drinks of alcohol per week. He reports that he does not use drugs.     Family History   Problem Relation Age of Onset   ? Diabetes Mother    ? COPD Father    ? Cancer Paternal Grandmother    ? Cancer Paternal Grandfather           There is no immunization history on file for this patient.     Allergies:    Allergies   Allergen Reactions   ? Oxycodone ANXIETY ? acetaminophen (TYLENOL) 500 mg tablet Take 1-2 Tabs by mouth every 6 hours while awake. Max of 4,000 mg of acetaminophen in 24 hours.   ? aspirin EC 81 mg tablet Take one tablet by mouth daily. Take with food.   ? ceFAZolin (ANCEF) 1 g/5 mL injection Administer two g through vein every 8 hours.   ? docusate (COLACE) 100 mg capsule Take one capsule by mouth twice daily.   ? gabapentin (NEURONTIN) 300 mg capsule Take 1 Cap by mouth three times daily. In addition to 600 mg capsule TID for a total of 900 mg TID.   ? gabapentin (NEURONTIN) 600 mg tablet Take 1 Tab by mouth three times daily.   ? HYDROcodone/acetaminophen (NORCO) 5/325 mg tablet Take one tablet to two tablets by mouth every 4 hours as needed for Pain  Max 8 tabs/day   ? linezolid (ZYVOX) 600 mg tablet Take one tablet by mouth twice daily.   ? methocarbamoL (ROBAXIN) 500 mg tablet Take 1,000 mg by mouth three times daily as needed for Muscle Cramps or Spasms.   ? nortriptyline (PAMELOR) 25 mg capsule Take one capsule by mouth at bedtime daily.   ? ondansetron (ZOFRAN) 4 mg tablet Take one tablet by mouth every 8 hours as needed for Nausea or Vomiting. Indications: prevent nausea and vomiting after surgery   ? telmisartan (MICARDIS) 20 mg tablet Take 20 mg by mouth daily.   ? valACYclovir (VALTREX) 1 gram tablet Take one tablet by mouth twice daily. Starting 4/17 in the evening.         Vitals:    06/24/19 1039   BP: (!) 150/92   Pulse: 95   Temp: 36.5 ?C (97.7 ?F)   Weight: 94.8 kg (209 lb)   Height: 180.3 cm (71)   PainSc: Four       Physical Exam:  Gen:  Awake, alert, NAD at rest  HEENT:  No icterus, no conj hemorrhages  Nodes:  No cervical, supraclavicular, axillary, epitrochlear adenopathy  Lungs:  clear  Heart:  Reg, no murmur  Abd:  + BS, soft, NT  Ext:  No edema; right foot in partial cast  Skin:  No visible rash  Musculoskel:  no other warm or tender joints  Psych:  O x 3; normal mood & affect        Lab/Radiology/Other Diagnostic Tests:  CBC w/Diff    Lab Results   Component Value Date/Time    WBC 5.2 05/20/2019 12:29 PM    RBC 5.24 05/20/2019 12:29 PM    HGB 16.1 05/20/2019 12:29 PM    HCT 47.7 05/20/2019 12:29 PM    MCV 91.1 05/20/2019 12:29 PM    MCH 30.7 05/20/2019 12:29 PM    MCHC 33.7 05/20/2019 12:29 PM    RDW 13.2 05/20/2019 12:29 PM    PLTCT 240 05/20/2019 12:29 PM    MPV 8.2 05/20/2019 12:29 PM    Lab Results   Component Value Date/Time    NEUT 57 05/20/2019 12:29 PM    ANC 3.02 05/20/2019 12:29 PM    LYMA 30 05/20/2019 12:29 PM    ALC 1.58 05/20/2019 12:29 PM    MONA 8 05/20/2019 12:29 PM    AMC 0.41 05/20/2019 12:29 PM    EOSA 4 05/20/2019 12:29 PM    AEC 0.18 05/20/2019 12:29 PM    BASA 1 05/20/2019 12:29 PM    ABC 0.05 05/20/2019 12:29 PM         Comprehensive Metabolic Profile    Lab Results   Component Value Date/Time    NA 140 07/12/2018 05:18 AM    K 4.1 07/12/2018 05:18 AM    CL 109 07/12/2018 05:18 AM    CO2 24 07/12/2018 05:18 AM    GAP 7 07/12/2018 05:18 AM    BUN 10 07/12/2018 05:18 AM    CR 1.14 07/12/2018 05:18 AM    GLU 93 07/12/2018 05:18 AM    Lab Results   Component Value Date/Time    CA 8.5 07/12/2018 05:18 AM    PO4 3.3 07/06/2018 06:25 PM    ALBUMIN 3.2 (L) 07/12/2018 05:18 AM    TOTPROT 6.3 07/12/2018 05:18 AM    ALKPHOS 43 07/12/2018 05:18 AM    AST 22 07/12/2018 05:18 AM    ALT 21 07/12/2018 05:18 AM    TOTBILI 0.6 07/12/2018 05:18 AM    GFR >60 07/12/2018 05:18 AM    GFRAA >60 07/12/2018 05:18 AM            Assessment / Plan :    Distal fibula fx (July 2020)  - ORIF of the right distal fibula by Dr. Aundria Rud on 09/26/2018  - redness/pain/swelling ~ 2weeks following procedure; treated for Staphylococcal infection at the time    Distal fibula hardware infection / osteomyelitis  - progressive right ankle / foot pain with numbness along dorsum of foot  -  Orthopedic Surgery clinic on 05/20/2019 - second opinion  - Outside films (02/17/2019) were reviewed.  This revealed healing of the distal fibula fracture.  There was some evidence of lucency about the plate.  On review of the film, do not appreciate substantial lucency about the screws.  - Labs (2/23): WBC = 5.2; ESR <1; CRP 0.14  - OR 06/18/2019.  Extensive soft tissue impingement anteriorly.  This was debrided back.  Incision was taken down to the plate.  Cultures were required.  Right ankle hardware was removed and the ankle was debrided.  - Operative cultures (3/23): Group B streptococcus + MSSA.  Anaerobic and fungal cultures remain no growth thus far.      ? Today, midline catheter placed.  ? Will d/c linezolid.  ? Will start cefazolin 2 g IV q 8 hrs  ? Recommend 5 weeks of IV cefazolin  ? Weekly CBC + diff, CMP  ? RTC in ~ 4 weeks  Thank you Dr. Essie Christine for the opportunity to see this pleasant patient in clinic today.

## 2019-06-24 ENCOUNTER — Ambulatory Visit: Admit: 2019-06-24 | Discharge: 2019-06-24

## 2019-06-24 ENCOUNTER — Encounter: Admit: 2019-06-24 | Discharge: 2019-06-24

## 2019-06-24 DIAGNOSIS — R0781 Pleurodynia: Secondary | ICD-10-CM

## 2019-06-24 DIAGNOSIS — I10 Essential (primary) hypertension: Secondary | ICD-10-CM

## 2019-06-24 DIAGNOSIS — D492 Neoplasm of unspecified behavior of bone, soft tissue, and skin: Secondary | ICD-10-CM

## 2019-06-24 DIAGNOSIS — T847XXA Infection and inflammatory reaction due to other internal orthopedic prosthetic devices, implants and grafts, initial encounter: Secondary | ICD-10-CM

## 2019-06-24 DIAGNOSIS — M869 Osteomyelitis, unspecified: Secondary | ICD-10-CM

## 2019-06-24 DIAGNOSIS — M549 Dorsalgia, unspecified: Secondary | ICD-10-CM

## 2019-06-24 MED ORDER — SODIUM CHLORIDE 0.9 % FLUSH
5-10 mL | Freq: Three times a day (TID) | 0 refills | Status: AC
Start: 2019-06-24 — End: 2019-06-29

## 2019-06-24 NOTE — Procedures
PICC Line Insertion Procedure Note    NAME:Franklin Graves                                             MRN: [id number]                 DOB:12-Jan-1986          AGE: 34 y.o.  ADMISSION DATE: 06/24/2019             DAYS ADMITTED: LOS: 0 days      Procedure Details: Informed consent was obtained for the procedure.  Risks of infection, blood clot, and nerve or vessel damage were discussed.     Indications: Long Term IV therapy     Procedure: Under sterile conditions the skin at the insertion site was prepped with chlorhexadineand covered with a sterile drape. Local anesthesia was applied to the skin and subcutaneous tissues.  A #4 FR, Single, PICC was inserted in theRight Basilic vein per hospital protocol. Blood return:  Yes Catheter trimmed, inserted to 44 cm, with 0 cm external.  Catheter was flushed with 10 mL NS. Patient did tolerate procedure well. Mid upper arm circumference is 34 cm. Vessel size 4.52mm, occupancy 27%.    Verification:Placement confirmed with ECG., Patency verified by positive blood return., Venous location confirmed by ultrasound. and Surveyor, quantity given to patient and/or left at bedside. PICC tip placed ACJ per 3CG/[place].

## 2019-06-26 ENCOUNTER — Encounter: Admit: 2019-06-26 | Discharge: 2019-06-26

## 2019-06-26 DIAGNOSIS — T847XXA Infection and inflammatory reaction due to other internal orthopedic prosthetic devices, implants and grafts, initial encounter: Secondary | ICD-10-CM

## 2019-07-01 ENCOUNTER — Encounter: Admit: 2019-07-01 | Discharge: 2019-07-01

## 2019-07-01 DIAGNOSIS — Z95828 Presence of other vascular implants and grafts: Secondary | ICD-10-CM

## 2019-07-01 NOTE — Telephone Encounter
Today, informed by patient's home health nurse of some chest pressure symptoms.  Contacted the patient today.  He states that the day following placement of his PICC, he has some mild central chest pressure.  This has not gotten any worse over the last 1 week.  No radiation of pain.  No palpitations.  No presyncopal or syncopal spells.  Otherwise, he is doing well, tolerating the antibiotics well.    We will request 2 view CXR, locally if possible, to evaluate the tip of the PICC.  If the tip is too distal, we will plan on pulling back on the catheter accordingly.

## 2019-07-01 NOTE — Telephone Encounter
Doug [facility] RN from [place] reported that pt mentioned during today's visit that 1 or 2 days after getting picc placed he feels "something" inside his chest. "Kind of like pressure, but from inside and it is minimal". It is constant and has not worsened or changed at all and nothing makes it go away or worse. Picc line has great blood return and infuses without difficulty.     DR Cletus Gash notified.

## 2019-07-02 ENCOUNTER — Encounter: Admit: 2019-07-02 | Discharge: 2019-07-02

## 2019-07-02 ENCOUNTER — Emergency Department: Admit: 2019-07-02 | Discharge: 2019-07-02 | Discharge status: Against Medical Advice

## 2019-07-02 NOTE — Telephone Encounter
Pt called back and stated he had left  ED last night because of the "shady characters and pt in cuffs that the cops brought in". They made him feel uncomfortable and pt states he had a "massive head ache and just did not feel well and did not want to wait".     Pt states his wife said he "passed out" while he was sitting on their bed and when he came to he was very SOA and felt palpitations. He said no matter what he did, he felt like he "could not catch his breath". Pt then called [facility] and they advised he come to ED so his aunt brought him, but he left before he was seen. Today he did not answer his phone all day because he slept due to the "horrible headache and being very tired".     Advised pt to go to nearest ED like [facility] and have picc line evaluated and CXR done. Did teaching on what can happen if his line is actually causing the symptoms he had last night and re-iterated several times how this could happen again at any time. Pt still refused to go to ED at this time. Pt states he has a PCP appt tom in am at 0930 and then he will come to [facility] to get CXR done. Provided pt with IV Team Tracy's number so that he can call her as soon as he gets to [facility] and she can read the report. Then if his line needs to be pulled out a bit, she will go ahead and get pt in for that. If the CXR shows that the picc line is in the correct place then informed pt he will still need to go to ED and get evaluated.     Pt verbalized understanding especially that my fist advise is for him to go to ED right now.       Will also inform Dr Cletus Gash who had already spoken to pt yesterday before pt had any symptoms as described by pt last night.

## 2019-07-02 NOTE — ED Notes
Call x 1 with no response.

## 2019-07-02 NOTE — ED Notes
Call x 3 with no response.

## 2019-07-02 NOTE — Telephone Encounter
Have tried calling pt x2 and left messages requesting a call back. Also called cell on file, but voicemail is not set up.   Called pt's emergency contact which is his mom and she will try to reach out to pt to call me back.     Also received a message from [facility] stating pt had called their nursing around 2 am last night stating he had palpitations and he had been referred to go to ED. Pt did come to ED, but didn't want to wait to be seen so went home.  [place] ED tried to call pt, but also was unable to reach him.       I called [facility] and spoke to [place] in radiology dept who stated pt has not been there today.

## 2019-07-02 NOTE — ED Notes
Call x 2 with no response

## 2019-07-03 ENCOUNTER — Encounter: Admit: 2019-07-03 | Discharge: 2019-07-03

## 2019-07-03 ENCOUNTER — Ambulatory Visit: Admit: 2019-07-03 | Discharge: 2019-07-03

## 2019-07-03 LAB — COMPREHENSIVE METABOLIC PANEL
Lab: 0.9
Lab: 12
Lab: 4.2

## 2019-07-03 LAB — CBC AND DIFF: Lab: 5

## 2019-07-03 NOTE — Progress Notes
[facility] consulted to pull picc line back 1.5cm after XR. After CXR, I called Dr. Cletus Gash to discuss placement. Dr. Cletus Gash wanted picc pulled back 1.5cm after XR was done.

## 2019-07-07 ENCOUNTER — Ambulatory Visit: Admit: 2019-07-07 | Discharge: 2019-07-07

## 2019-07-07 ENCOUNTER — Encounter: Admit: 2019-07-07 | Discharge: 2019-07-07

## 2019-07-07 DIAGNOSIS — M549 Dorsalgia, unspecified: Secondary | ICD-10-CM

## 2019-07-07 DIAGNOSIS — R0781 Pleurodynia: Secondary | ICD-10-CM

## 2019-07-07 DIAGNOSIS — I10 Essential (primary) hypertension: Secondary | ICD-10-CM

## 2019-07-07 DIAGNOSIS — D492 Neoplasm of unspecified behavior of bone, soft tissue, and skin: Secondary | ICD-10-CM

## 2019-07-07 NOTE — Patient Instructions
Please do not hesitate to contact my office with any questions.    Dr. Bryan Vopat & Stephanie Caldwell PA-C - Orthopedic Surgeon, Sports Medicine  [facility] - Phone [phone] - Fax [phone]   [address], [address] - [place], [place] [address]    Kara Schuessler BSN, RN - Clinical Nurse Coordinator  Casey Conover BSN, RN - Clinical Nurse Coordinator  Lucy Woolever MS, ATC, LAT - Clinical Athletic Trainer    Thank you for supporting our practice! Your feedback helps us deliver the highest quality of care.  Review us at: [email or web address]

## 2019-07-08 ENCOUNTER — Encounter: Admit: 2019-07-08 | Discharge: 2019-07-08

## 2019-07-11 ENCOUNTER — Encounter: Admit: 2019-07-11 | Discharge: 2019-07-11

## 2019-07-11 DIAGNOSIS — T847XXA Infection and inflammatory reaction due to other internal orthopedic prosthetic devices, implants and grafts, initial encounter: Secondary | ICD-10-CM

## 2019-07-11 LAB — CBC AND DIFF: Lab: 4.5

## 2019-07-11 LAB — COMPREHENSIVE METABOLIC PANEL
Lab: 1
Lab: 15
Lab: 25
Lab: 30
Lab: 4.4
Lab: 59

## 2019-07-24 MED ORDER — TERBINAFINE HCL 250 MG PO TAB
250 mg | ORAL_TABLET | Freq: Every day | ORAL | 0 refills | 29.00000 days | Status: AC
Start: 2019-07-24 — End: ?

## 2019-07-28 ENCOUNTER — Encounter: Admit: 2019-07-28 | Discharge: 2019-07-28

## 2019-07-28 DIAGNOSIS — Z792 Long term (current) use of antibiotics: Secondary | ICD-10-CM

## 2019-07-28 NOTE — Telephone Encounter
Infectious Diseases Outpatient Orders:    Today's Date: 07/28/19    Per Dr. Nelda Severe,     1. Please D/C Cefazolin after last dose on 07/29/19    2. D/C picc line on 07/30/19, call [phone] to confirm line was removed    3. D/C weekly labs and picc care after line removed    Above orders faxed to [facility].

## 2019-07-31 ENCOUNTER — Encounter: Admit: 2019-07-31 | Discharge: 2019-07-31

## 2019-07-31 NOTE — Telephone Encounter
Confirmed with Maralyn Sago at [facility] was removed on 07/30/19 and all weekly lab orders and antibiotics were discontinued. Pt to follow up in ID prn.

## 2019-08-04 ENCOUNTER — Encounter: Admit: 2019-08-04 | Discharge: 2019-08-04

## 2019-08-04 NOTE — Telephone Encounter
Pt called w/ report that he has a small rash at site where picc line had been inserted. Discussed w/ pt that that likely due to adhesive from occlusive pressure dressing placed on site after removal. Asked pt to call me back midweek if rash is not improving.

## 2019-08-05 ENCOUNTER — Encounter: Admit: 2019-08-05 | Discharge: 2019-08-05

## 2019-08-05 ENCOUNTER — Ambulatory Visit: Admit: 2019-08-05 | Discharge: 2019-08-05

## 2019-08-05 DIAGNOSIS — D492 Neoplasm of unspecified behavior of bone, soft tissue, and skin: Secondary | ICD-10-CM

## 2019-08-05 DIAGNOSIS — D3613 Benign neoplasm of peripheral nerves and autonomic nervous system of lower limb, including hip: Secondary | ICD-10-CM

## 2019-08-05 DIAGNOSIS — Z9889 Other specified postprocedural states: Secondary | ICD-10-CM

## 2019-08-05 DIAGNOSIS — M549 Dorsalgia, unspecified: Secondary | ICD-10-CM

## 2019-08-05 DIAGNOSIS — R0781 Pleurodynia: Secondary | ICD-10-CM

## 2019-08-05 DIAGNOSIS — Z969 Presence of functional implant, unspecified: Secondary | ICD-10-CM

## 2019-08-05 DIAGNOSIS — I10 Essential (primary) hypertension: Secondary | ICD-10-CM

## 2019-08-05 MED ORDER — MELOXICAM 7.5 MG PO TAB
7.5 mg | ORAL_TABLET | Freq: Every day | ORAL | 0 refills | 30.00000 days | Status: AC
Start: 2019-08-05 — End: ?

## 2019-08-05 NOTE — Progress Notes
SUBJECTIVE:  Franklin Graves presents today for a 6 week post-operative follow up on his Right ankle arthroscopy with hardware removal on 06/18/19.  Surgical cultures grew Strep.  He was treated with 6 weeks of IV cefazolin.  His PICC line was removed on 07/30/2019.  He has a rash around the PICC line.  The rash over the lateral aspect of the ankle has also returned.  He has developed a new complaint of pain over the medial aspect of the ankle in the anterior tib area.  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  He is doing well with minimal complaints over the lateral ankle.  Plain films of the right ankle indicates:  Old healed fracture deformity distal fibula with interval removal of the fixation plate plate and screws and lucent screw tracks in the distal fibula.     OBJECTIVE:  Right foot and ankle was evaluated. Wound clean and dry, no evidence of drainage or erythema, neurovascularly intact.  Tender to palpation just lateral to the anterior tib tendon.      Impression    ICD-9-CM ICD-10-CM    1. Post-operative state  V45.89 Z98.890 ANKLE MIN 3 VIEWS RIGHT   2. 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 Retained orthopedic hardware  V45.89 Z96.9 AMB REFERRAL TO PHYSICAL OR OCCUPATIONAL THERAPY       S/p Right ankle arthroscopy with hardware removal    Plan:  Boot- wear while walking.  Mobic 7.5mg  given today  Take one tablet daily with food  Discontinue if experience stomach discomfort  Do not take any additional NSAIDS (Naprosyn, Ibuprofen) while on this medication      Use ice daily on affected joint for 20 minutes preferably in the evening  Always cover skin with a towel and then apply ice    Outpatient PT  F/U in 6 weeks.  No films needed.      Encounter Medications   Medications   ? meloxicam (MOBIC) 7.5 mg tablet     Sig: Take one tablet by mouth daily.     Dispense:  90 tablet     Refill:  0     Order Specific Question:   Collaborating / Supervising Provider     Answer:   Tanja Port [id number]

## 2019-08-19 ENCOUNTER — Encounter: Admit: 2019-08-19 | Discharge: 2019-08-19

## 2019-09-16 ENCOUNTER — Encounter: Admit: 2019-09-16 | Discharge: 2019-09-16

## 2019-09-16 ENCOUNTER — Ambulatory Visit: Admit: 2019-09-16 | Discharge: 2019-09-16

## 2019-09-16 DIAGNOSIS — R0781 Pleurodynia: Secondary | ICD-10-CM

## 2019-09-16 DIAGNOSIS — I10 Essential (primary) hypertension: Secondary | ICD-10-CM

## 2019-09-16 DIAGNOSIS — Z9889 Other specified postprocedural states: Secondary | ICD-10-CM

## 2019-09-16 DIAGNOSIS — D492 Neoplasm of unspecified behavior of bone, soft tissue, and skin: Secondary | ICD-10-CM

## 2019-09-16 DIAGNOSIS — M549 Dorsalgia, unspecified: Secondary | ICD-10-CM

## 2019-09-16 NOTE — Patient Instructions
Please do not hesitate to contact my office with any questions.    Dr. Bryan Vopat & Stephanie Caldwell PA-C - Orthopedic Surgeon, Sports Medicine  [facility] - Phone [phone] - Fax [phone]   [address], [address] - [place], [place] [address]    Kara Schuessler BSN, RN - Clinical Nurse Coordinator  Casey Conover BSN, RN - Clinical Nurse Coordinator  Lacrisha Bielicki MS, ATC, LAT - Clinical Athletic Trainer    Thank you for supporting our practice! Your feedback helps us deliver the highest quality of care.  Review us at: [email or web address]

## 2019-12-07 IMAGING — RF XR ankle RT 2V
1 series · 4 of 4 positions shown · non-contrast
Comparison: none

[Series 1: run · 4 of 4 slices shown]
[im 1/4]
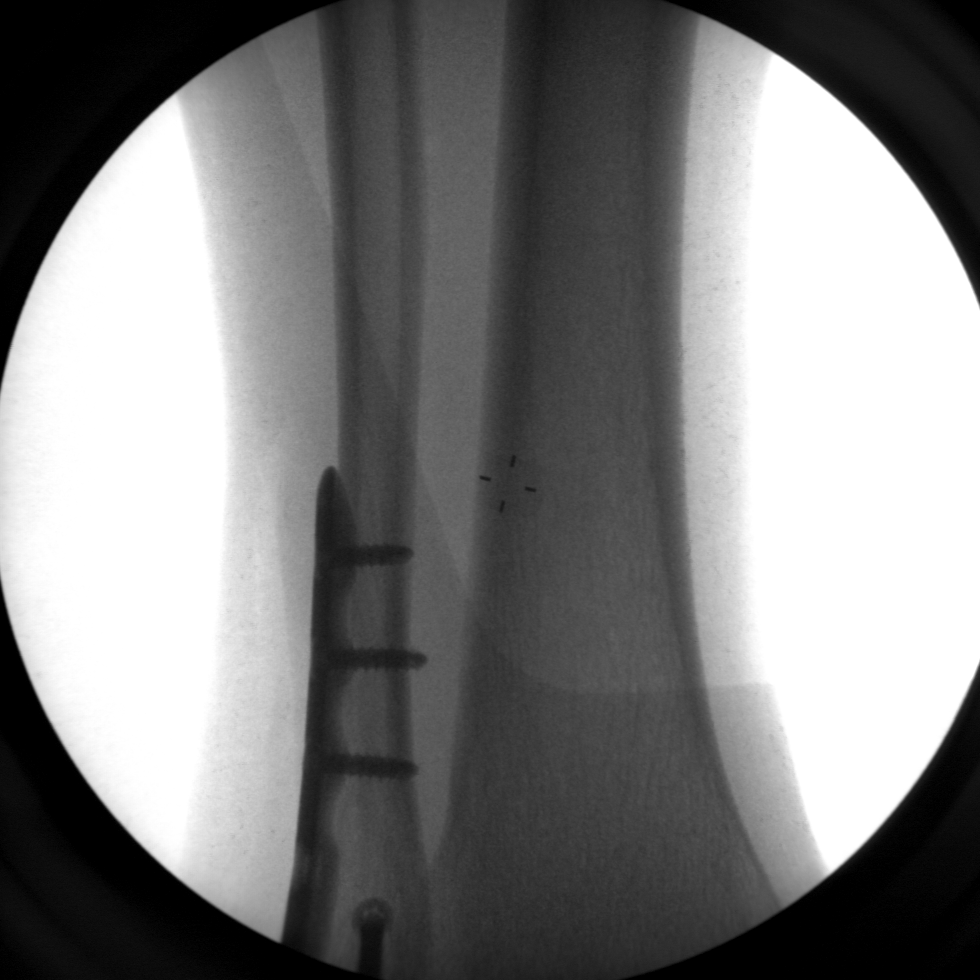
[im 2/4]
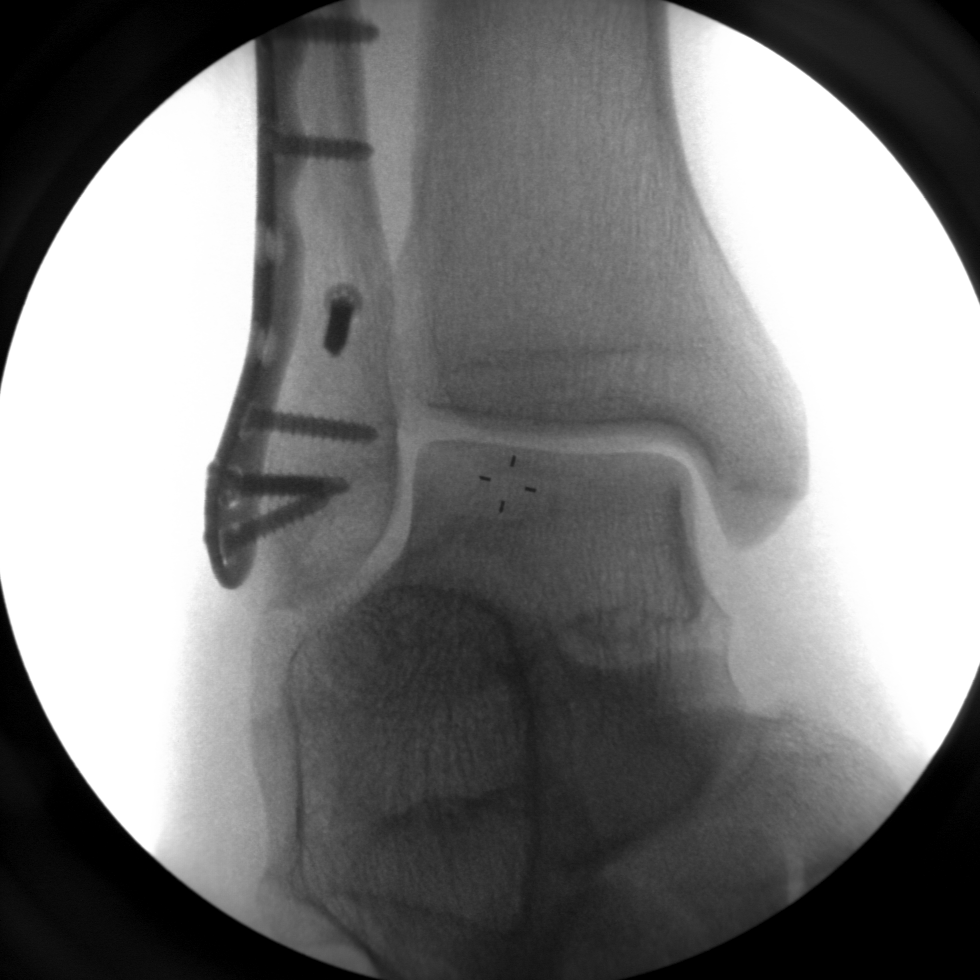
[im 3/4]
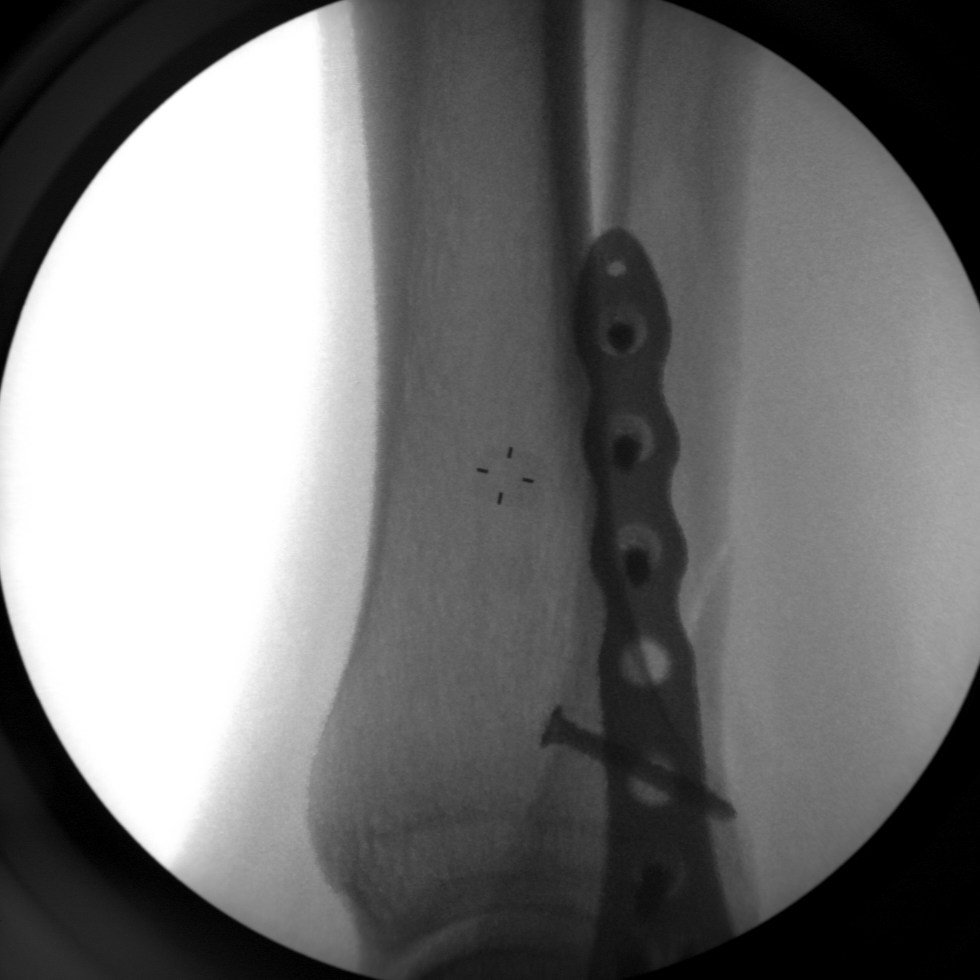
[im 4/4]
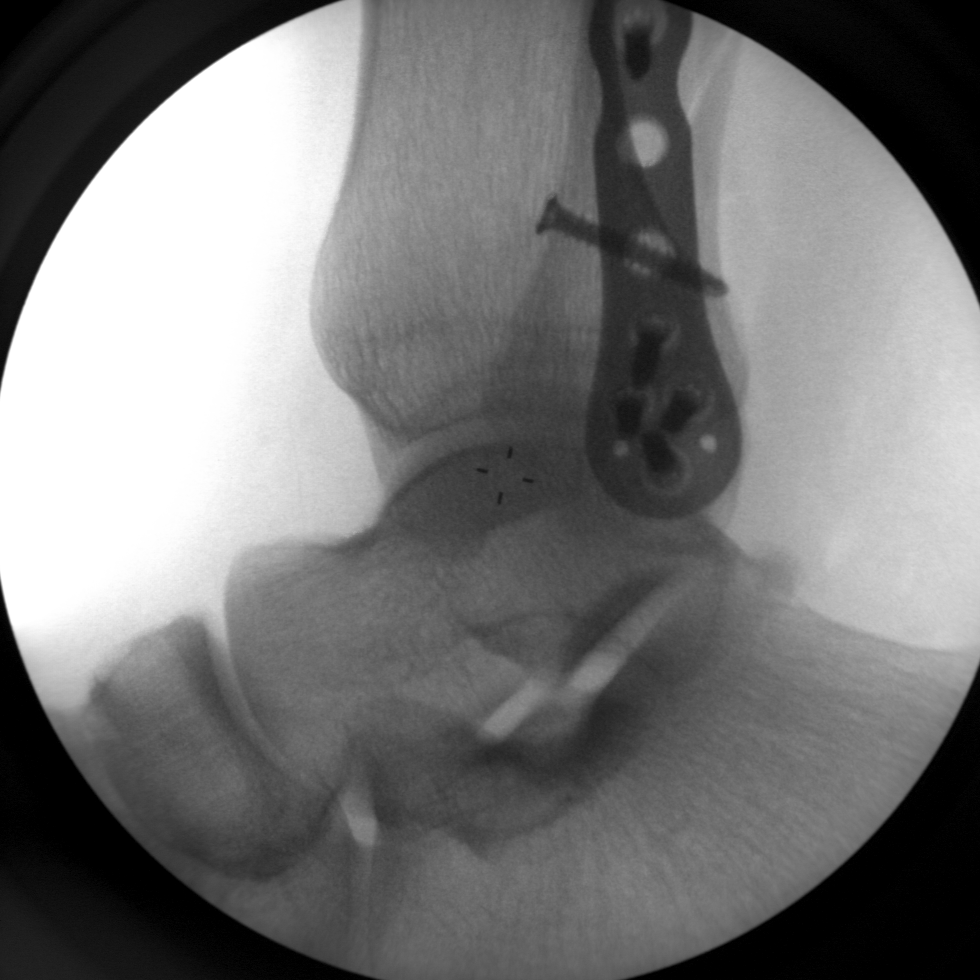

[4 of 4 positions shown; findings below may reference images not displayed]

DIAGNOSTIC STUDIES

EXAM
Fluoroscopic guidance for right ankle surgery

INDICATION
ORIF RIGHT ANKLE
ORIF RIGHT ANKLE. 4 FLUORO IMAGES. 20.9 SECONDS FLUORO TIME. PATIENT DOSE 0.55 mGy. TJ

TECHNIQUE
Five spot films were obtained. Total fluoro time was 20.9 seconds.

COMPARISONS
None available

FINDINGS
Fluoroscopic guidance was provided for screw and plate fixation of distal fibular fracture. Please
see procedure note is for full details.

IMPRESSION
Fluoroscopic guidance was provided for ORIF right ankle.

Tech Notes:

ORIF RIGHT ANKLE. 4 FLUORO IMAGES. 20.9 SECONDS FLUORO TIME. PATIENT DOSE 0.55 mGy. TJ

## 2020-10-07 ENCOUNTER — Encounter: Admit: 2020-10-07 | Discharge: 2020-10-07

## 2020-10-07 DIAGNOSIS — M25571 Pain in right ankle and joints of right foot: Secondary | ICD-10-CM

## 2020-10-12 ENCOUNTER — Encounter: Admit: 2020-10-12 | Discharge: 2020-10-12

## 2020-10-12 ENCOUNTER — Ambulatory Visit: Admit: 2020-10-12 | Discharge: 2020-10-12

## 2020-10-12 DIAGNOSIS — I10 Essential (primary) hypertension: Secondary | ICD-10-CM

## 2020-10-12 DIAGNOSIS — M25571 Pain in right ankle and joints of right foot: Secondary | ICD-10-CM

## 2020-10-12 DIAGNOSIS — M549 Dorsalgia, unspecified: Secondary | ICD-10-CM

## 2020-10-12 DIAGNOSIS — R0781 Pleurodynia: Secondary | ICD-10-CM

## 2020-10-12 DIAGNOSIS — Z9889 Other specified postprocedural states: Secondary | ICD-10-CM

## 2020-10-12 DIAGNOSIS — D492 Neoplasm of unspecified behavior of bone, soft tissue, and skin: Secondary | ICD-10-CM

## 2020-10-12 NOTE — Patient Instructions
Please do not hesitate to contact my office with any questions.    Dr. Bryan Vopat & Stephanie Caldwell PA-C - Orthopedic Surgeon, Sports Medicine  [facility] - Phone [phone] - Fax [phone]   [address], [address] - [place], [place] [address]    To schedule an appointment, please call our scheduling line at [phone]    Elius Etheredge BSN, RN - Clinical Nurse Coordinator  Casey Conover BSN, RN - Clinical Nurse Coordinator  Megan Burki MS, ATC, LAT - Clinical Athletic Trainer    Thank you for supporting our practice! Your feedback helps us deliver the highest quality of care.  Review us at: [email or web address]

## 2021-09-05 ENCOUNTER — Encounter: Admit: 2021-09-05 | Discharge: 2021-09-05

## 2021-11-21 ENCOUNTER — Encounter: Admit: 2021-11-21 | Discharge: 2021-11-21
